# Patient Record
Sex: Female | Born: 1955
Health system: Southern US, Community
[De-identification: ages and names within clinical notes are randomized; demographics above are authoritative.]

## PROBLEM LIST (undated history)

## (undated) DIAGNOSIS — R490 Dysphonia: Secondary | ICD-10-CM

## (undated) DIAGNOSIS — R002 Palpitations: Secondary | ICD-10-CM

## (undated) DIAGNOSIS — Z9889 Other specified postprocedural states: Secondary | ICD-10-CM

## (undated) DIAGNOSIS — R112 Nausea with vomiting, unspecified: Secondary | ICD-10-CM

## (undated) HISTORY — PX: ESOPHAGOGASTRODUODENOSCOPY: SHX1529

## (undated) HISTORY — PX: MOUTH SURGERY: SHX715

## (undated) HISTORY — PX: TONSILLECTOMY: SUR1361

## (undated) HISTORY — PX: KNEE ARTHROSCOPY: SUR90

## (undated) HISTORY — PX: OTHER SURGICAL HISTORY: SHX169

---

## 2013-11-14 ENCOUNTER — Ambulatory Visit (INDEPENDENT_AMBULATORY_CARE_PROVIDER_SITE_OTHER): Payer: BC Managed Care – PPO | Admitting: Family Medicine

## 2013-11-14 VITALS — BP 122/78 | HR 86 | Temp 99.2°F | Resp 17 | Ht 62.5 in | Wt 121.0 lb

## 2013-11-14 DIAGNOSIS — J029 Acute pharyngitis, unspecified: Secondary | ICD-10-CM

## 2013-11-14 DIAGNOSIS — R0982 Postnasal drip: Secondary | ICD-10-CM

## 2013-11-14 LAB — POCT CBC
Granulocyte percent: 77.4 %G (ref 37–80)
HCT, POC: 42.1 % (ref 37.7–47.9)
Hemoglobin: 13.5 g/dL (ref 12.2–16.2)
Lymph, poc: 1.7 (ref 0.6–3.4)
MCH, POC: 31.8 pg — AB (ref 27–31.2)
MCHC: 32.1 g/dL (ref 31.8–35.4)
MCV: 99.4 fL — AB (ref 80–97)
MID (cbc): 0.4 (ref 0–0.9)
MPV: 9.1 fL (ref 0–99.8)
POC Granulocyte: 7 — AB (ref 2–6.9)
POC LYMPH PERCENT: 18.4 % (ref 10–50)
POC MID %: 4.2 %M (ref 0–12)
Platelet Count, POC: 257 10*3/uL (ref 142–424)
RBC: 4.24 M/uL (ref 4.04–5.48)
RDW, POC: 12.9 %
WBC: 9 10*3/uL (ref 4.6–10.2)

## 2013-11-14 LAB — POCT RAPID STREP A (OFFICE): Rapid Strep A Screen: NEGATIVE

## 2013-11-14 MED ORDER — FLUTICASONE PROPIONATE 50 MCG/ACT NA SUSP: 2.0000 | Freq: Every day | NASAL | Status: DC

## 2013-11-14 NOTE — Progress Notes (Signed)
Chief Complaint:  Chief Complaint  Patient presents with  . Sore Throat  . Laryngitis    HPI: Dawn Gilbert is a 58 y.o. female who is here for 3 month history of intermittent scratchy sore throat and then laryngitis, lasts for 4-5 days then goes away. She work at [employer]. No fevers, no rashes. She has some swollen glands .  No coughing. No wheezing, SOB, CP. She has laryngitis. Denies GERD. She thought it was allergies when it started 3 months ago. When she smokes it itches really bad and burns. She is a smoker.   History reviewed. No pertinent past medical history. History reviewed. No pertinent past surgical history. History   Social History  . Marital Status: Married    Spouse Name: N/A    Number of Children: N/A  . Years of Education: N/A   Social History Main Topics  . Smoking status: Current Every Day Smoker -- 0.50 packs/day for 31 years    Types: Cigarettes  . Smokeless tobacco: None  . Alcohol Use: None  . Drug Use: None  . Sexual Activity: None   Other Topics Concern  . None   Social History Narrative  . None   History reviewed. No pertinent family history. Allergies  Allergen Reactions  . Penicillins Hives   Prior to Admission medications   Not on File     ROS: The patient denies fevers, chills, night sweats, unintentional weight loss, chest pain, palpitations, wheezing, dyspnea on exertion, nausea, vomiting, abdominal pain, dysuria, hematuria, melena, numbness, weakness, or tingling.  All other systems have been reviewed and were otherwise negative with the exception of those mentioned in the HPI and as above.    PHYSICAL EXAM: Filed Vitals:   11/14/13 1024  BP: 122/78  Pulse: 86  Temp: 99.2 F (37.3 C)  Resp: 17   Filed Vitals:   11/14/13 1024  Height: 5' 2.5" (1.588 m)  Weight: 121 lb (54.885 kg)   Body mass index is 21.76 kg/(m^2).  General: Alert, no acute distress HEENT:  Normocephalic, atraumatic, oropharynx patent.  EOMI, PERRLA. + PND, no exudates, erthymeatous throat, TM nl Cardiovascular:  Regular rate and rhythm, no rubs murmurs or gallops.  No Carotid bruits, radial pulse intact. No pedal edema.  Respiratory: Clear to auscultation bilaterally.  No wheezes, rales, or rhonchi.  No cyanosis, no use of accessory musculature GI: No organomegaly, abdomen is soft and non-tender, positive bowel sounds.  No masses. Skin: No rashes. Neurologic: Facial musculature symmetric. Psychiatric: Patient is appropriate throughout our interaction. Lymphatic: No cervical lymphadenopathy Musculoskeletal: Gait intact.   LABS: Results for orders placed in visit on 11/14/13  POCT CBC      Result Value Range   WBC 9.0  4.6 - 10.2 K/uL   Lymph, poc 1.7  0.6 - 3.4   POC LYMPH PERCENT 18.4  10 - 50 %L   MID (cbc) 0.4  0 - 0.9   POC MID % 4.2  0 - 12 %M   POC Granulocyte 7.0 (*) 2 - 6.9   Granulocyte percent 77.4  37 - 80 %G   RBC 4.24  4.04 - 5.48 M/uL   Hemoglobin 13.5  12.2 - 16.2 g/dL   HCT, POC 42.1  37.7 - 47.9 %   MCV 99.4 (*) 80 - 97 fL   MCH, POC 31.8 (*) 27 - 31.2 pg   MCHC 32.1  31.8 - 35.4 g/dL   RDW, POC 12.9  Platelet Count, POC 257  142 - 424 K/uL   MPV 9.1  0 - 99.8 fL  POCT RAPID STREP A (OFFICE)      Result Value Range   Rapid Strep A Screen Negative  Negative     EKG/XRAY:   Primary read interpreted by Dr. Marin Comment at [facility].   ASSESSMENT/PLAN: Encounter Diagnoses  Name Primary?  . Acute pharyngitis Yes  . PND (post-nasal drip)    OTc nasocort or try flonase Strep cx pending F/u prn  Gross sideeffects, risk and benefits, and alternatives of medications d/w patient. Patient is aware that all medications have potential sideeffects and we are unable to predict every sideeffect or drug-drug interaction that may occur.  Greco Gastelum, [place], DO 11/14/2013 4:13 PM

## 2013-11-14 NOTE — Patient Instructions (Signed)
Pharyngitis °Pharyngitis is redness, pain, and swelling (inflammation) of your pharynx.  °CAUSES  °Pharyngitis is usually caused by infection. Most of the time, these infections are from viruses (viral) and are part of a cold. However, sometimes pharyngitis is caused by bacteria (bacterial). Pharyngitis can also be caused by allergies. Viral pharyngitis may be spread from person to person by coughing, sneezing, and personal items or utensils (cups, forks, spoons, toothbrushes). Bacterial pharyngitis may be spread from person to person by more intimate contact, such as kissing.  °SIGNS AND SYMPTOMS  °Symptoms of pharyngitis include:   °· Sore throat.   °· Tiredness (fatigue).   °· Low-grade fever.   °· Headache. °· Joint pain and muscle aches. °· Skin rashes. °· Swollen lymph nodes. °· Plaque-like film on throat or tonsils (often seen with bacterial pharyngitis). °DIAGNOSIS  °Your health care provider will ask you questions about your illness and your symptoms. Your medical history, along with a physical exam, is often all that is needed to diagnose pharyngitis. Sometimes, a rapid strep test is done. Other lab tests may also be done, depending on the suspected cause.  °TREATMENT  °Viral pharyngitis will usually get better in 3 4 days without the use of medicine. Bacterial pharyngitis is treated with medicines that kill germs (antibiotics).  °HOME CARE INSTRUCTIONS  °· Drink enough water and fluids to keep your urine clear or pale yellow.   °· Only take over-the-counter or prescription medicines as directed by your health care provider:   °· If you are prescribed antibiotics, make sure you finish them even if you start to feel better.   °· Do not take aspirin.   °· Get lots of rest.   °· Gargle with 8 oz of salt water (½ tsp of salt per 1 qt of water) as often as every 1 2 hours to soothe your throat.   °· Throat lozenges (if you are not at risk for choking) or sprays may be used to soothe your throat. °SEEK MEDICAL  CARE IF:  °· You have large, tender lumps in your neck. °· You have a rash. °· You cough up green, yellow-brown, or bloody spit. °SEEK IMMEDIATE MEDICAL CARE IF:  °· Your neck becomes stiff. °· You drool or are unable to swallow liquids. °· You vomit or are unable to keep medicines or liquids down. °· You have severe pain that does not go away with the use of recommended medicines. °· You have trouble breathing (not caused by a stuffy nose). °MAKE SURE YOU:  °· Understand these instructions. °· Will watch your condition. °· Will get help right away if you are not doing well or get worse. °Document Released: 10/20/2005 Document Revised: 08/10/2013 Document Reviewed: 06/27/2013 °[employer]® Patient Information ©2014 [employer]. ° °

## 2013-11-16 LAB — CULTURE, GROUP A STREP: Organism ID, Bacteria: NORMAL

## 2013-12-02 ENCOUNTER — Ambulatory Visit: Payer: Self-pay

## 2013-12-16 ENCOUNTER — Ambulatory Visit: Payer: Self-pay | Admitting: Family Medicine

## 2013-12-16 VITALS — BP 120/60 | HR 94 | Temp 98.0°F | Resp 16 | Ht 63.0 in | Wt 123.0 lb

## 2013-12-16 DIAGNOSIS — F17200 Nicotine dependence, unspecified, uncomplicated: Secondary | ICD-10-CM

## 2013-12-16 DIAGNOSIS — Z720 Tobacco use: Secondary | ICD-10-CM

## 2013-12-16 DIAGNOSIS — R490 Dysphonia: Secondary | ICD-10-CM

## 2013-12-16 NOTE — Patient Instructions (Signed)
We will refer you to ENT for eval in next 2 weeks. Continue allergy treatment with flonase and claritin. Ok to try zantac in case some acid reflux causing symptoms, but will need evaluation with ENT.  Return to the clinic or go to the nearest emergency room if any of your symptoms worsen or new symptoms occur.  Hoarseness Hoarseness is produced from a variety of causes. It is important to find the cause so it can be treated. In the absence of a cold or upper respiratory illness, any hoarseness lasting more than 2 weeks should be looked at by a specialist. This is especially important if you have a history of smoking or alcohol use. It is also important to keep in mind that as you grow older, your voice will naturally get weaker, making it easier for you to become hoarse from straining your vocal cords.  CAUSES  Any illness that affects your vocal cords can result in a hoarse voice. Examples of conditions that can affect the vocal cords are listed as follows:   Allergies.  Colds.  Sinusitis.  Gastroesophageal reflux disease.  Croup.  Injury.  Nodules.  Exposure to smoke or toxic fumes or gases.  Congenital and genetic defects.  Paralysis of the vocal cords.  Infections.  Advanced age. DIAGNOSIS  In order to diagnose the cause of your hoarseness, your caregiver will examine your throat using an instrument that uses a tube with a small lighted camera (laryngoscope). It allows your caregiver to look into the mouth and down the throat. TREATMENT  For most cases, treatment will focus on the specific cause of the hoarseness. Depending on the cause, hoarseness can be a temporary condition (acute) or it can be long lasting (chronic). Most cases of hoarseness clear up without complications. Your caregiver will explain to you if this is not likely to happen. SEEK IMMEDIATE MEDICAL CARE IF:   You have increasing hoarseness or loss of voice.  You have shortness of breath.  You are  coughing up blood.  There is pain in your neck or throat. Document Released: 10/03/2005 Document Revised: 01/12/2012 Document Reviewed: 12/26/2010 [facility] Patient Information 2014 [place], [place].

## 2013-12-16 NOTE — Progress Notes (Signed)
Subjective:    Patient ID: Dawn Gilbert, female    DOB: Feb 11, 1956, 58 y.o.   MRN: [id number] This chart was scribed for Dawn Ray, MD by Rolanda Lundborg, ED Scribe. This patient was seen in room 2 and the patient's care was started at 10:19 AM.  Chief Complaint  Patient presents with  . Hoarse    x 1 month    HPI HPI Comments: [name] is a 58 y.o. female who presents to the [facility] for a f/u on pharyngitis. She was last seen one month ago by Dr Marin Comment with a 3 month h/o intermittent scrathy sore throat and laryngitis that last up to 45 days then resolves. Diagnosed with acute pharyngitis. Recommend OTC Nasacort or Flonase for postnasal drip. Here for follow up.  She reports she is still having intermittent throat soreness on the left side. She states she is still hoarse, with little to no improvement. She states her voice gets more and more hoarse as the day goes on, especially with use of her voice. She has been using 2 sprays of Flonase on each side every morning. She denies heartburn, trouble swallowing, throat swelling. She has been smoking 2 cigarettes per day. Before the sickness she was smoking 1/2 PPD for 25 years. She denies smokeless tobacco use.   PCP - No primary provider on file.   There are no active problems to display for this patient.  History reviewed. No pertinent past medical history. History reviewed. No pertinent past surgical history. Allergies  Allergen Reactions  . Penicillins Hives   Prior to Admission medications   Medication Sig Start Date End Date Taking? Authorizing Provider  fluticasone (FLONASE) 50 MCG/ACT nasal spray Place 2 sprays into both nostrils daily. 11/14/13   Thao P Le, DO    History  Substance Use Topics  . Smoking status: Current Every Day Smoker -- 0.50 packs/day for 31 years    Types: Cigarettes  . Smokeless tobacco: Not on file  . Alcohol Use: Not on file      Review of Systems  Constitutional: Negative  for fever, chills, diaphoresis, appetite change and unexpected weight change.  HENT: Positive for sore throat and voice change. Negative for trouble swallowing.   Respiratory: Positive for cough (rarely, at night).        Objective:   Physical Exam  Vitals reviewed. Constitutional: She is oriented to person, place, and time. She appears well-developed and well-nourished. No distress.  HENT:  Head: Normocephalic and atraumatic.  Right Ear: Hearing, tympanic membrane, external ear and ear canal normal.  Left Ear: Hearing, tympanic membrane, external ear and ear canal normal.  Nose: Nose normal.  Mouth/Throat: Oropharynx is clear and moist. No oropharyngeal exudate or posterior oropharyngeal erythema.  Eyes: Conjunctivae and EOM are normal. Pupils are equal, round, and reactive to light.  Neck: Normal range of motion. No thyromegaly present.  Slight submandibular tenderness.  Cardiovascular: Normal rate, regular rhythm, normal heart sounds and intact distal pulses.   No murmur heard. Pulmonary/Chest: Effort normal and breath sounds normal. No respiratory distress. She has no wheezes. She has no rhonchi.  Lymphadenopathy:    She has no cervical adenopathy.  Neurological: She is alert and oriented to person, place, and time.  Skin: Skin is warm and dry. No rash noted.  Psychiatric: She has a normal mood and affect. Her behavior is normal.     Filed Vitals:   12/16/13 1007  BP: 120/60  Pulse: 94  Temp: 98 F (36.7 C)  TempSrc: Oral  Resp: 16  Height: 5\' 3"  (1.6 m)  Weight: 123 lb (55.792 kg)  SpO2: 96%        Assessment & Plan:   [name] is a 58 y.o. female Hoarseness of voice - Plan: Ambulatory referral to ENT  Tobacco abuse - Plan: Ambulatory referral to ENT  Persistent hoarseness for past month with intermittent laryngitis symptoms prior for 3 months. Ddx includes AR versus LPR but also with tobacco history, recommend ENT eval for possible further eval and imaging.  Referred to ENT and can try Zantac OTC in the mean time. rtc precautions.   No orders of the defined types were placed in this encounter.   Patient Instructions  We will refer you to ENT for eval in next 2 weeks. Continue allergy treatment with flonase and claritin. Ok to try zantac in case some acid reflux causing symptoms, but will need evaluation with ENT.  Return to the clinic or go to the nearest emergency room if any of your symptoms worsen or new symptoms occur.  Hoarseness Hoarseness is produced from a variety of causes. It is important to find the cause so it can be treated. In the absence of a cold or upper respiratory illness, any hoarseness lasting more than 2 weeks should be looked at by a specialist. This is especially important if you have a history of smoking or alcohol use. It is also important to keep in mind that as you grow older, your voice will naturally get weaker, making it easier for you to become hoarse from straining your vocal cords.  CAUSES  Any illness that affects your vocal cords can result in a hoarse voice. Examples of conditions that can affect the vocal cords are listed as follows:   Allergies.  Colds.  Sinusitis.  Gastroesophageal reflux disease.  Croup.  Injury.  Nodules.  Exposure to smoke or toxic fumes or gases.  Congenital and genetic defects.  Paralysis of the vocal cords.  Infections.  Advanced age. DIAGNOSIS  In order to diagnose the cause of your hoarseness, your caregiver will examine your throat using an instrument that uses a tube with a small lighted camera (laryngoscope). It allows your caregiver to look into the mouth and down the throat. TREATMENT  For most cases, treatment will focus on the specific cause of the hoarseness. Depending on the cause, hoarseness can be a temporary condition (acute) or it can be long lasting (chronic). Most cases of hoarseness clear up without complications. Your caregiver will explain to you if  this is not likely to happen. SEEK IMMEDIATE MEDICAL CARE IF:   You have increasing hoarseness or loss of voice.  You have shortness of breath.  You are coughing up blood.  There is pain in your neck or throat. Document Released: 10/03/2005 Document Revised: 01/12/2012 Document Reviewed: 12/26/2010 [facility] Patient Information 2014 [place], [place].     I personally performed the services described in this documentation, which was scribed in my presence. The recorded information has been reviewed and considered, and addended by me as needed.

## 2014-01-02 ENCOUNTER — Other Ambulatory Visit (HOSPITAL_COMMUNITY): Payer: Self-pay | Admitting: Otolaryngology

## 2014-01-03 ENCOUNTER — Encounter (HOSPITAL_COMMUNITY): Payer: Self-pay | Admitting: Pharmacy Technician

## 2014-01-03 NOTE — Pre-Procedure Instructions (Signed)
Dawn Gilbert  01/03/2014   Your procedure is scheduled on:  Fri, Mar 6 @ 7:30 AM  Report to [facility] Stay Entrance A  at 5:30 AM.  Call this number if you have problems the morning of surgery: [phone]   Remember:   Do not eat food or drink liquids after midnight.   Take these medicines the morning of surgery with A SIP OF WATER: Prednisone(Deltasone-if not completed dose pack day of surgery),Doxycycline(Doryx),and Fluticasone(Flonase-if needed)             No Goody's,BC's,Aleve,Aspirin,Ibuprofen,Fish Oil,or any Herbal Medications   Do not wear jewelry, make-up or nail polish.  Do not wear lotions, powders, or perfumes. You may wear deodorant.  Do not shave 48 hours prior to surgery.   Do not bring valuables to the hospital.  [facility] is not responsible                  for any belongings or valuables.               Contacts, dentures or bridgework may not be worn into surgery.  Leave suitcase in the car. After surgery it may be brought to your room.  For patients admitted to the hospital, discharge time is determined by your                treatment team.               Patients discharged the day of surgery will not be allowed to drive  home.    Special Instructions:  [place] - Preparing for Surgery  Before surgery, you can play an important role.  Because skin is not sterile, your skin needs to be as free of germs as possible.  You can reduce the number of germs on you skin by washing with CHG (chlorahexidine gluconate) soap before surgery.  CHG is an antiseptic cleaner which kills germs and bonds with the skin to continue killing germs even after washing.  Please DO NOT use if you have an allergy to CHG or antibacterial soaps.  If your skin becomes reddened/irritated stop using the CHG and inform your nurse when you arrive at Short Stay.  Do not shave (including legs and underarms) for at least 48 hours prior to the first CHG shower.  You may shave your  face.  Please follow these instructions carefully:   1.  Shower with CHG Soap the night before surgery and the                                morning of Surgery.  2.  If you choose to wash your hair, wash your hair first as usual with your       normal shampoo.  3.  After you shampoo, rinse your hair and body thoroughly to remove the                      Shampoo.  4.  Use CHG as you would any other liquid soap.  You can apply chg directly       to the skin and wash gently with scrungie or a clean washcloth.  5.  Apply the CHG Soap to your body ONLY FROM THE NECK DOWN.        Do not use on open wounds or open sores.  Avoid contact with your eyes,  ears, mouth and genitals (private parts).  Wash genitals (private parts)       with your normal soap.  6.  Wash thoroughly, paying special attention to the area where your surgery        will be performed.  7.  Thoroughly rinse your body with warm water from the neck down.  8.  DO NOT shower/wash with your normal soap after using and rinsing off       the CHG Soap.  9.  Pat yourself dry with a clean towel.            10.  Wear clean pajamas.            11.  Place clean sheets on your bed the night of your first shower and do not        sleep with pets.  Day of Surgery  Do not apply any lotions/deoderants the morning of surgery.  Please wear clean clothes to the hospital/surgery center.     Please read over the following fact sheets that you were given: Pain Booklet, Coughing and Deep Breathing and Surgical Site Infection Prevention

## 2014-01-04 ENCOUNTER — Encounter (HOSPITAL_COMMUNITY): Payer: Self-pay

## 2014-01-04 ENCOUNTER — Encounter (HOSPITAL_COMMUNITY)
Admission: RE | Admit: 2014-01-04 | Discharge: 2014-01-04 | Disposition: A | Discharge status: Home / Self Care | Payer: BC Managed Care – PPO | Source: Ambulatory Visit | Attending: Otolaryngology | Admitting: Otolaryngology

## 2014-01-04 ENCOUNTER — Emergency Department (HOSPITAL_COMMUNITY): Payer: BC Managed Care – PPO

## 2014-01-04 ENCOUNTER — Emergency Department (HOSPITAL_COMMUNITY)
Admission: EM | Admit: 2014-01-04 | Discharge: 2014-01-04 | Disposition: A | Discharge status: Home / Self Care | Payer: BC Managed Care – PPO | Attending: Emergency Medicine | Admitting: Emergency Medicine

## 2014-01-04 ENCOUNTER — Encounter (HOSPITAL_COMMUNITY): Payer: Self-pay | Admitting: Emergency Medicine

## 2014-01-04 ENCOUNTER — Other Ambulatory Visit: Payer: Self-pay

## 2014-01-04 ENCOUNTER — Encounter (HOSPITAL_COMMUNITY): Admission: RE | Admit: 2014-01-04 | Payer: BC Managed Care – PPO | Source: Ambulatory Visit

## 2014-01-04 DIAGNOSIS — IMO0002 Reserved for concepts with insufficient information to code with codable children: Secondary | ICD-10-CM | POA: Insufficient documentation

## 2014-01-04 DIAGNOSIS — Z792 Long term (current) use of antibiotics: Secondary | ICD-10-CM | POA: Insufficient documentation

## 2014-01-04 DIAGNOSIS — I4891 Unspecified atrial fibrillation: Secondary | ICD-10-CM

## 2014-01-04 DIAGNOSIS — I471 Supraventricular tachycardia, unspecified: Secondary | ICD-10-CM

## 2014-01-04 DIAGNOSIS — Z880 Allergy status to penicillin: Secondary | ICD-10-CM | POA: Insufficient documentation

## 2014-01-04 DIAGNOSIS — F17200 Nicotine dependence, unspecified, uncomplicated: Secondary | ICD-10-CM | POA: Insufficient documentation

## 2014-01-04 HISTORY — DX: Other specified postprocedural states: R11.2

## 2014-01-04 HISTORY — DX: Dysphonia: R49.0

## 2014-01-04 HISTORY — DX: Other specified postprocedural states: Z98.890

## 2014-01-04 LAB — BASIC METABOLIC PANEL
BUN: 15 mg/dL (ref 6–23)
CHLORIDE: 103 meq/L (ref 96–112)
CO2: 27 mEq/L (ref 19–32)
Calcium: 10.3 mg/dL (ref 8.4–10.5)
Creatinine, Ser: 0.86 mg/dL (ref 0.50–1.10)
GFR calc Af Amer: 84 mL/min — ABNORMAL LOW (ref >90)
GFR calc non Af Amer: 73 mL/min — ABNORMAL LOW (ref >90)
GLUCOSE: 90 mg/dL (ref 70–99)
POTASSIUM: 3.5 meq/L — AB (ref 3.7–5.3)
Sodium: 145 mEq/L (ref 137–147)

## 2014-01-04 LAB — COMPREHENSIVE METABOLIC PANEL
ALBUMIN: 4.2 g/dL (ref 3.5–5.2)
ALK PHOS: 48 U/L (ref 39–117)
ALT: 16 U/L (ref 0–35)
AST: 19 U/L (ref 0–37)
BUN: 16 mg/dL (ref 6–23)
CO2: 22 mEq/L (ref 19–32)
Calcium: 10.4 mg/dL (ref 8.4–10.5)
Chloride: 104 mEq/L (ref 96–112)
Creatinine, Ser: 0.84 mg/dL (ref 0.50–1.10)
GFR calc non Af Amer: 75 mL/min — ABNORMAL LOW (ref >90)
GFR, EST AFRICAN AMERICAN: 86 mL/min — AB (ref >90)
Glucose, Bld: 90 mg/dL (ref 70–99)
POTASSIUM: 3.7 meq/L (ref 3.7–5.3)
SODIUM: 144 meq/L (ref 137–147)
Total Bilirubin: 0.3 mg/dL (ref 0.3–1.2)
Total Protein: 7.3 g/dL (ref 6.0–8.3)

## 2014-01-04 LAB — CBC
HEMATOCRIT: 37.1 % (ref 36.0–46.0)
HEMOGLOBIN: 12.9 g/dL (ref 12.0–15.0)
MCH: 32.5 pg (ref 26.0–34.0)
MCHC: 34.8 g/dL (ref 30.0–36.0)
MCV: 93.5 fL (ref 78.0–100.0)
Platelets: 251 10*3/uL (ref 150–400)
RBC: 3.97 MIL/uL (ref 3.87–5.11)
RDW: 12.4 % (ref 11.5–15.5)
WBC: 11.2 10*3/uL — ABNORMAL HIGH (ref 4.0–10.5)

## 2014-01-04 LAB — CBC WITH DIFFERENTIAL/PLATELET
BASOS ABS: 0 10*3/uL (ref 0.0–0.1)
BASOS PCT: 0 % (ref 0–1)
EOS ABS: 0.1 10*3/uL (ref 0.0–0.7)
EOS PCT: 1 % (ref 0–5)
HCT: 37.4 % (ref 36.0–46.0)
Hemoglobin: 13 g/dL (ref 12.0–15.0)
Lymphocytes Relative: 32 % (ref 12–46)
Lymphs Abs: 3.8 10*3/uL (ref 0.7–4.0)
MCH: 32 pg (ref 26.0–34.0)
MCHC: 34.8 g/dL (ref 30.0–36.0)
MCV: 92.1 fL (ref 78.0–100.0)
Monocytes Absolute: 0.9 10*3/uL (ref 0.1–1.0)
Monocytes Relative: 7 % (ref 3–12)
NEUTROS PCT: 59 % (ref 43–77)
Neutro Abs: 7.1 10*3/uL (ref 1.7–7.7)
PLATELETS: 243 10*3/uL (ref 150–400)
RBC: 4.06 MIL/uL (ref 3.87–5.11)
RDW: 12.4 % (ref 11.5–15.5)
WBC: 11.9 10*3/uL — ABNORMAL HIGH (ref 4.0–10.5)

## 2014-01-04 LAB — TSH: TSH: 1.509 u[IU]/mL (ref 0.350–4.500)

## 2014-01-04 LAB — TROPONIN I: Troponin I: <0.3 ng/mL (ref <0.30)

## 2014-01-04 LAB — T4: T4 TOTAL: 8.2 ug/dL (ref 5.0–12.5)

## 2014-01-04 MED ORDER — SODIUM CHLORIDE 0.9 % IV SOLN
INTRAVENOUS | Status: DC
  Administered 2014-01-04: 125 mL/h via INTRAVENOUS

## 2014-01-04 MED ORDER — ADENOSINE 6 MG/2ML IV SOLN
INTRAVENOUS | Status: AC
  Filled 2014-01-04: qty 2

## 2014-01-04 MED ORDER — DEXTROSE 5 % IV SOLN
5.0000 mg/h | INTRAVENOUS | Status: DC
  Administered 2014-01-04 (×2): 5 mg/h via INTRAVENOUS

## 2014-01-04 MED ORDER — METOPROLOL SUCCINATE ER 25 MG PO TB24: 25.0000 mg | ORAL_TABLET | Freq: Every day | ORAL | Status: DC

## 2014-01-04 NOTE — ED Provider Notes (Addendum)
CSN: [id number]     Arrival date & time 01/04/14  5638 History   First MD Initiated Contact with Patient 01/04/14 0957     Chief Complaint  Patient presents with  . Palpitations     (Consider location/radiation/quality/duration/timing/severity/associated sxs/prior Treatment) Patient is a 58 y.o. female presenting with palpitations. The history is provided by the patient.  Palpitations  complaining of increased heart rate that was found on her EKG when she was in preadmission testing. She denies any chest pain or shortness of breath. Denies any palpitations. Denies any syncope or near-syncope. States she's been her baseline health. She was in preop testing for a scheduled for a biopsy. An EKG was performed which showed a heart rate 194. She has been asymptomatic. Denies any prior history of SVT or atrial fibrillation. No treatment used prior to arrival.  Past Medical History  Diagnosis Date  . PONV (postoperative nausea and vomiting)   . Hoarseness    Past Surgical History  Procedure Laterality Date  . Tonsillectomy    . Knee arthroscopy Bilateral   . Mouth surgery    . 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 Esophagogastroduodenoscopy     Family History  Problem Relation Age of Onset  . Cancer Mother   . Cancer Father    History  Substance Use Topics  . Smoking status: Current Every Day Smoker -- 0.25 packs/day for 25 years    Types: Cigarettes  . Smokeless tobacco: Not on file  . Alcohol Use: No   OB History   Grav Para Term Preterm Abortions TAB SAB Ect Mult Living                 Review of Systems  Cardiovascular: Positive for palpitations.  All other systems reviewed and are negative.      Allergies  Penicillins  Home Medications   Current Outpatient Rx  Name  Route  Sig  Dispense  Refill  . doxycycline (DORYX) 100 MG DR capsule   Oral   Take 100 mg by mouth 2 (two) times daily.         . fluticasone (FLONASE) 50 MCG/ACT nasal spray   Each Nare   Place  1 spray into both nostrils daily as needed for allergies.         . predniSONE (DELTASONE) 10 MG tablet   Oral   Take 10-40 mg by mouth daily with breakfast. Dose pack          Pulse 135  Resp 17  SpO2 99% Physical Exam  Nursing note and vitals reviewed. Constitutional: She is oriented to person, place, and time. She appears well-developed and well-nourished.  Non-toxic appearance. No distress.  HENT:  Head: Normocephalic and atraumatic.  Eyes: Conjunctivae, EOM and lids are normal. Pupils are equal, round, and reactive to light.  Neck: Normal range of motion. Neck supple. No tracheal deviation present. No mass present.  Cardiovascular: Normal heart sounds.  An irregularly irregular rhythm present. Tachycardia present.  Exam reveals no gallop.   No murmur heard. Pulmonary/Chest: Effort normal and breath sounds normal. No stridor. No respiratory distress. She has no decreased breath sounds. She has no wheezes. She has no rhonchi. She has no rales.  Abdominal: Soft. Normal appearance and bowel sounds are normal. She exhibits no distension. There is no tenderness. There is no rebound and no CVA tenderness.  Musculoskeletal: Normal range of motion. She exhibits no edema and no tenderness.  Neurological: She is alert and oriented  to person, place, and time. She has normal strength. No cranial nerve deficit or sensory deficit. GCS eye subscore is 4. GCS verbal subscore is 5. GCS motor subscore is 6.  Skin: Skin is warm and dry. No abrasion and no rash noted.  Psychiatric: She has a normal mood and affect. Her speech is normal and behavior is normal.    ED Course  Procedures (including critical care time) Labs Review Labs Reviewed  CBC WITH DIFFERENTIAL  COMPREHENSIVE METABOLIC PANEL  T4  TSH  TROPONIN I   Imaging Review No results found.   EKG Interpretation None      MDM   Final diagnoses:  None     Date: 01/04/2014  Rate: 143  Rhythm: atrial fibrillation  QRS  Axis: normal  Intervals: normal  ST/T Wave abnormalities: nonspecific ST changes  Conduction Disutrbances:none  Narrative Interpretation:   Old EKG Reviewed: none available  Patient's initial EKG from short stay shows her to be in SVT. I used a vagal maneuvers on her and she is in an atrial fibrillation with rapid ventricular rate response 140. She'll be given Cardizem bolus of 10 mg and started on a Cardizem drip at 5 mg. Awaiting labs and x-rays.    11:35 AM  Date: 01/04/2014  Rate: 72  Rhythm: normal sinus rhythm  QRS Axis: normal  Intervals: normal  ST/T Wave abnormalities: normal  Conduction Disutrbances:none  Narrative Interpretation:   Old EKG Reviewed: none available  Patient reassessed abdomen place on Cardizem drip and is now sinus rhythm. She remains asymptomatic. We'll have cardiology evaluate  4:17 PM Pt seen by cards and cleared for d/c   CRITICAL CARE Performed by: Leota Jacobsen Total critical care time: 60 Critical care time was exclusive of separately billable procedures and treating other patients. Critical care was necessary to treat or prevent imminent or life-threatening deterioration. Critical care was time spent personally by me on the following activities: development of treatment plan with patient and/or surrogate as well as nursing, discussions with consultants, evaluation of patient's response to treatment, examination of patient, obtaining history from patient or surrogate, ordering and performing treatments and interventions, ordering and review of laboratory studies, ordering and review of radiographic studies, pulse oximetry and re-evaluation of patient's condition.    Leota Jacobsen, MD 01/04/14 1137  Leota Jacobsen, MD 01/04/14 [phone]

## 2014-01-04 NOTE — ED Notes (Signed)
Family at bedside. 

## 2014-01-04 NOTE — ED Notes (Signed)
Patient was upstairs in Pre-Admission for a biopsy on Friday. Patient started complaining of palpitations in her chest. Nurse performed EKG and patient's heart rate 194 and patient in SVT. No past medical history. Patient states this has happened one time before but is not currently on any medication for HR.

## 2014-01-04 NOTE — ED Notes (Signed)
Chest xray at the bedside

## 2014-01-04 NOTE — Progress Notes (Addendum)
Pt doesn't have a cardiologist  Denies ever having an echo/stress test/heart cath  Denies EKG or CXR in the past yr    Medical Md is Dr.Jeffrey Carlota Raspberry

## 2014-01-04 NOTE — Discharge Instructions (Signed)
Nonspecific Tachycardia Tachycardia is a faster than normal heartbeat (more than 100 beats per minute). In adults, the heart normally beats between 60 and 100 times a minute. A fast heartbeat may be a normal response to exercise or stress. It does not necessarily mean that something is wrong. However, sometimes when your heart beats too fast it may not be able to pump enough blood to the rest of your body. This can result in chest pain, shortness of breath, dizziness, and even fainting. Nonspecific tachycardia means that the specific cause or pattern of your tachycardia is unknown. CAUSES  Tachycardia may be harmless or it may be due to a more serious underlying cause. Possible causes of tachycardia include:  Exercise or exertion.  Fever.  Pain or injury.  Infection.  Loss of body fluids (dehydration).  Overactive thyroid.  Lack of red blood cells (anemia).  Anxiety and stress.  Alcohol.  Caffeine.  Tobacco products.  Diet pills.  Illegal drugs.  Heart disease. SYMPTOMS  Rapid or irregular heartbeat (palpitations).  Suddenly feeling your heart beating (cardiac awareness).  Dizziness.  Tiredness (fatigue).  Shortness of breath.  Chest pain.  Nausea.  Fainting. DIAGNOSIS  Your caregiver will perform a physical exam and take your medical history. In some cases, a heart specialist (cardiologist) may be consulted. Your caregiver may also order:  Blood tests.  Electrocardiography. This test records the electrical activity of your heart.  A heart monitoring test. TREATMENT  Treatment will depend on the likely cause of your tachycardia. The goal is to treat the underlying cause of your tachycardia. Treatment methods may include:  Replacement of fluids or blood through an intravenous (IV) tube for moderate to severe dehydration or anemia.  New medicines or changes in your current medicines.  Diet and lifestyle changes.  Treatment for certain  infections.  Stress relief or relaxation methods. HOME CARE INSTRUCTIONS   Rest.  Drink enough fluids to keep your urine clear or pale yellow.  Do not smoke.  Avoid:  Caffeine.  Tobacco.  Alcohol.  Chocolate.  Stimulants such as over-the-counter diet pills or pills that help you stay awake.  Situations that cause anxiety or stress.  Illegal drugs such as marijuana, phencyclidine (PCP), and cocaine.  Only take medicine as directed by your caregiver.  Keep all follow-up appointments as directed by your caregiver. SEEK IMMEDIATE MEDICAL CARE IF:   You have pain in your chest, upper arms, jaw, or neck.  You become weak, dizzy, or feel faint.  You have palpitations that will not go away.  You vomit, have diarrhea, or pass blood in your stool.  Your skin is cool, pale, and wet.  You have a fever that will not go away with rest, fluids, and medicine. MAKE SURE YOU:   Understand these instructions.  Will watch your condition.  Will get help right away if you are not doing well or get worse. Document Released: 11/27/2004 Document Revised: 01/12/2012 Document Reviewed: 09/30/2011 Exit[employer] Patient Information 2014 [employer].  

## 2014-01-04 NOTE — ED Notes (Signed)
Cardiology MD at the bedside.

## 2014-01-04 NOTE — Progress Notes (Signed)
Notified Dr.Gore's nurse of pt being in SVT and taken to the ED-will let him know and they will follow up

## 2014-01-04 NOTE — ED Notes (Signed)
Patient waiting on Consult to Cardiology.

## 2014-01-04 NOTE — Progress Notes (Signed)
EKG was done on pt in preadmit showing SVT with rate of 194,O2 applied at 2l/m via N/C.Pt denies pain.States she had not had any palpitations until bloodwork was done.Take to ED via stretcher

## 2014-01-04 NOTE — Consult Note (Signed)
CARDIOLOGY CONSULT NOTE   Patient ID: Dawn Gilbert MRN: [id number], DOB/AGE: 11/18/1955 59 y.o. Date of Encounter: 01/04/2014  Primary Physician: [name], MD Primary Cardiologist: New  Chief Complaint:  PAF  HPI: Dawn Gilbert is a 58 y.o. female with no history of CAD. She came to the hospital today for pre-op evaluation as she has a vocal cord biopsy scheduled for 03/06. This is to be done by Dr. Simeon Craft and is for hoarseness.   She was in her usual state of health today. She has no history of chest pain, DOE, dizziness or presyncope. She had palpitations once about 10 years ago, was evaluated but symptoms had resolved by the time she saw a doctor.   She has had no recent illnesses, used no OTC meds, no change in caffeine use, no dramatic increase in tobacco use, no extreme stress. She started a steroid taper on 03/02, with 40 mg x 2 days, today decreases to 30 mg daily and down 10 mg every 2 days. She feels a little amped up due to that, but no discomfort.   They took vital signs as part of her evaluation and she remembers her heart rate was normal. These are charted and at 9:15, her heart rate was 86. She laid down to get her ECG and felt her heart suddenly go very fast. Her heart rate was 150, 194 and SVT by ECG. She was sent to the ER, and got carotid sinus massage, she went into rapid atrial fib. She was placed on Cardizem IV and spontaneously converted to SR. She is currently maintaining SR.   Past Medical History  Diagnosis Date  . PONV (postoperative nausea and vomiting)   . Hoarseness     Surgical History:  Past Surgical History  Procedure Laterality Date  . Tonsillectomy    . Knee arthroscopy Bilateral   . Mouth surgery    . 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 Esophagogastroduodenoscopy       I have reviewed the patient's current medications. Prior to Admission medications   Medication Sig Start Date End Date Taking? Authorizing Provider  doxycycline (DORYX)  100 MG DR capsule Take 100 mg by mouth 2 (two) times daily.   Yes Historical Provider, MD  fluticasone (FLONASE) 50 MCG/ACT nasal spray Place 1 spray into both nostrils daily as needed for allergies.   Yes Historical Provider, MD  ibuprofen (ADVIL,MOTRIN) 200 MG tablet Take 600 mg by mouth every 6 (six) hours as needed for moderate pain.   Yes Historical Provider, MD  predniSONE (DELTASONE) 10 MG tablet Take 10-40 mg by mouth daily with breakfast. Dose pack   Yes Historical Provider, MD   Scheduled Meds:  Continuous Infusions: . sodium chloride 125 mL/hr (01/04/14 1225)  . diltiazem (CARDIZEM) infusion 5 mg/hr (01/04/14 1211)   Allergies:  Allergies  Allergen Reactions  . Penicillins Hives    History   Social History  . Marital Status: Married    Spouse Name: N/A    Number of Children: N/A  . Years of Education: N/A   Occupational History  . [employer]    Social History Main Topics  . Smoking status: Current Every Day Smoker -- 0.50 packs/day for 25 years    Types: Cigarettes  . Smokeless tobacco: Not on file  . Alcohol Use: No     Comment: Rare use  . Drug Use: No  . Sexual Activity: Yes    Birth Control/ Protection: Post-menopausal  Other Topics Concern  . Not on file   Social History Narrative   Lives with husband in [place]    Family History  Problem Relation Age of Onset  . Cancer Mother   . Cancer Father    Family Status  Relation Status Death Age  . Mother Deceased 38    [place]  . Father Deceased 19    Lung [place]    Review of Systems:   Full 14-point review of systems otherwise negative except as noted above.  Physical Exam: Blood pressure 91/60, pulse 65, resp. rate 18, SpO2 100.00%. General: Well developed, well nourished,female in no acute distress. Head: Normocephalic, atraumatic, sclera non-icteric, no xanthomas, nares are without discharge. Dentition: good  Neck: No carotid bruits. JVD not elevated. No thyromegally Lungs:  Good expansion bilaterally. without wheezes or rhonchi.  Heart: Regular rate and rhythm with S1 S2.  No S3 or S4.  No murmur, no rubs, or gallops appreciated. Abdomen: Soft, non-tender, non-distended with normoactive bowel sounds. No hepatomegaly. No rebound/guarding. No obvious abdominal masses. Msk:  Strength and tone appear normal for age. No joint deformities or effusions, no spine or costo-vertebral angle tenderness. Extremities: No clubbing or cyanosis. No edema.  Distal pedal pulses are 2+ in 4 extrem Neuro: Alert and oriented X 3. Moves all extremities spontaneously. No focal deficits noted. Psych:  Responds to questions appropriately with a normal affect. Skin: No rashes or lesions noted  Labs:   Lab Results  Component Value Date   WBC 11.9* 01/04/2014   HGB 13.0 01/04/2014   HCT 37.4 01/04/2014   MCV 92.1 01/04/2014   PLT 243 01/04/2014     Recent Labs Lab 01/04/14 1000  NA 144  K 3.7  CL 104  CO2 22  BUN 16  CREATININE 0.84  CALCIUM 10.4  PROT 7.3  BILITOT 0.3  ALKPHOS 48  ALT 16  AST 19  GLUCOSE 90    Recent Labs  01/04/14 1000  TROPONINI <0.30   Radiology/Studies: Dg Chest Port 1 View 01/04/2014   CLINICAL DATA:  Pain and hoarseness  EXAM: PORTABLE CHEST - 1 VIEW  COMPARISON:  None.  FINDINGS: The lungs are clear. The heart size and pulmonary vascularity are normal. No adenopathy. No bone lesions.  IMPRESSION: No edema or consolidation.   Electronically Signed   By: Lowella Grip M.D.   On: 01/04/2014 10:40   ECG: SVT w/ LBBB Rate 194, then atrial fib, RVR w/ rate 145, then SR, no acute changes.    ASSESSMENT AND PLAN:  Active Problems:   * No active hospital problems. *   Signed, Rosaria Ferries, PA-C 01/04/2014 2:53 PM Beeper [phone] As above, patient seen and examined. Briefly she is a 58 year old female with no prior cardiac history. She does not have dyspnea on exertion, orthopnea, PND, pedal edema, syncope or exertional chest pain. She had an episode  of palpitations approximately 10 years ago. It lasted 20 minutes and resolved spontaneously. No further cardiac workup at that time. She presented today for preoperative evaluation prior to vocal cord biopsy. Prior to having her electrocardiogram she developed palpitations. Her electrocardiogram demonstrated wide complex tachycardia at a rate of 194. This was felt to be SVT with aberrancy. Carotid massage converted the patient to atrial fibrillation. She was given Cardizem and then converted to sinus rhythm. She is now asymptomatic. Followup electrocardiogram shows sinus rhythm with no ST changes and no preexcitation. Electrocardiograms were reviewed with Dr. Caryl Comes. Patient has SVT with aberrancy. Her  episodes are infrequent. I will treat with metoprolol 25 mg daily. If she has more frequent episodes in the future we will refer for ablation. Check echocardiogram and TSH. Followup with me in 2 weeks. Patient counseled on discontinuing tobacco. There is no contraindication to proceeding with biopsy of vocal cord. Kirk Ruths 4:13 PM

## 2014-01-05 ENCOUNTER — Ambulatory Visit (HOSPITAL_COMMUNITY)
Admission: RE | Admit: 2014-01-05 | Discharge: 2014-01-05 | Disposition: A | Discharge status: Home / Self Care | Payer: BC Managed Care – PPO | Source: Home / Self Care | Attending: Cardiology | Admitting: Cardiology

## 2014-01-05 DIAGNOSIS — I059 Rheumatic mitral valve disease, unspecified: Secondary | ICD-10-CM

## 2014-01-05 DIAGNOSIS — I4891 Unspecified atrial fibrillation: Secondary | ICD-10-CM

## 2014-01-05 NOTE — Progress Notes (Signed)
Anesthesia Note:  Patient is a 58 year old female scheduled for direct laryngoscopy tomorrow by Dr. Simeon Craft for evaluation of hoarseness.  She has been on doxycycline and Prednisone since 01/02/14. Her PAT visit was yesterday morning, and just after her lab draw she developed palpitations.  EKG was done and showed wide complex tachycardia (may be SVT or atrial flutter with left BBB or VT--Dr. Stanford Breed thought it was likely SVT with aberrancy).  She was otherwise asymptomatic. She was immediately transferred to the ED where she underwent vagal maneuvers and by notes she developed afib with RVR at 140 bpm.  She was started on IV Cardizem and evaluated by cardiologist Dr. Stanford Breed. She later converted to NSR as evidenced by follow-up EKG on 01/04/14 at 11:30 AM. He also had EP cardiologist Dr. Caryl Comes review EKGs.  She apparently had a similar episode ~ 10 years ago. She was ultimately discharged on metoprolol and set up for an echocardiogram today--report is still pending. It she develops more frequent episodes then ablation will be considered.  He is planning to have her follow-up in two weeks.  He felt there was no contraindication to proceeding with biopsy of her vocal cord tomorrow.  Labs and CXR from yesterday noted.  George Hugh [facility] Short Stay Center/Anesthesiology Phone [phone] 01/05/2014 2:02 PM

## 2014-01-05 NOTE — H&P (Addendum)
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  She now presents for direct laryngoscopy with biopsy of the false and true vocal folds.  Dr. Simeon Craft, Alroy Dust has discussed the risks (bleeding, hoarseness, airway injury, infection, risks of general anesthesia including heart attack/MI), benefits, and alternatives of this procedure. The patient understands the risks and would like to proceed with the procedure. The chances of success of the procedure are >50% and the patient understands this. I personally performed an examination of the patient within 24 hours of the procedure.  PAST MEDICAL HISTORY: Past Medical History  Diagnosis Date  . PONV (postoperative nausea and vomiting)   . Hoarseness     PAST SURGICAL HISTORY: Past Surgical History  Procedure Laterality Date  . Tonsillectomy    . Knee arthroscopy Bilateral   . Mouth surgery    . 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 Esophagogastroduodenoscopy      MEDICATIONS: No current facility-administered medications on file prior to encounter.   No current outpatient prescriptions on file prior to encounter.    ALLERGIES: Allergies  Allergen Reactions  . Penicillins Hives    SOCIAL HISTORY: History   Social History  . Marital Status: Married    Spouse Name: N/A    Number of Children: N/A  . Years of Education: N/A   Occupational History  . [employer]    Social History Main Topics  . Smoking status: Current Every Day Smoker -- 0.50 packs/day for 25 years    Types: Cigarettes  . Smokeless tobacco: Not on file  . Alcohol Use: No     Comment: Rare use  . Drug Use: No  . Sexual Activity: Yes    Birth Control/ Protection: Post-menopausal   Other Topics Concern  . Not on file   Social History  Narrative   Lives with husband in [place]. 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    FAMILY HISTORY:  Family History  Problem Relation Age of Onset  . Cancer Mother   . Cancer Father     REVIEW OF SYSTEMS:  HEENT: hoarseness, otherwise negative x 12 systems except per HPI   PHYSICAL EXAM:  GENERAL:NAD, no stridor   VITAL SIGNS:   Filed Vitals:   01/06/14 0622  BP: 125/70  Pulse: 70  Temp: 98 F (36.7 C)  Resp: 18   SKIN:  Warm, dry HEENT:  Hoarse voice, oral cavity grossly clear, office laryngoscopy showed diffuse bilateral false and true vocal fold mucosal irregularities. NECK:  Supple, trachea midline LYMPH:  No lymphadenopathy palpated ABDOMEN:  soft MUSCULOSKELETAL: normal strength PSYCH:  Normal affect NEUROLOGIC:  CN 2-12 intact and symmetric  DIAGNOSTIC STUDIES: had EKG from pre-op after patient went in to Afib, this then resolved to normal sinus rhythm with calcium channel blocker. Was evaluated by cardiology who cleared her for laryngoscopy but recommended metoprolol and possible ablation later if her symptoms are refractory to medical treatment.  ASSESSMENT AND PLAN: Plan to proceed with direct laryngoscopy with biopsy. Cardiology is treating her Atrial fibrillation and cleared her for direct laryngoscopy. Patient understands the risks, benefits, and alternatives.  Informed written consent signed witnessed and on chart. Dawn Gilbert 01/06/2014 7:15 AM

## 2014-01-05 NOTE — Progress Notes (Signed)
  Echocardiogram 2D Echocardiogram has been performed.  Basilia Jumbo 01/05/2014, 12:21 PM

## 2014-01-06 ENCOUNTER — Encounter (HOSPITAL_COMMUNITY)
Admission: RE | Disposition: A | Discharge status: Home / Self Care | Payer: Self-pay | Source: Ambulatory Visit | Attending: Otolaryngology

## 2014-01-06 ENCOUNTER — Ambulatory Visit (HOSPITAL_COMMUNITY): Payer: BC Managed Care – PPO | Admitting: Anesthesiology

## 2014-01-06 ENCOUNTER — Ambulatory Visit (HOSPITAL_COMMUNITY)
Admission: RE | Admit: 2014-01-06 | Discharge: 2014-01-06 | Disposition: A | Discharge status: Home / Self Care | Payer: BC Managed Care – PPO | Source: Ambulatory Visit | Attending: Otolaryngology | Admitting: Otolaryngology

## 2014-01-06 ENCOUNTER — Encounter (HOSPITAL_COMMUNITY): Payer: Self-pay | Admitting: *Deleted

## 2014-01-06 ENCOUNTER — Encounter (HOSPITAL_COMMUNITY): Payer: BC Managed Care – PPO | Admitting: Vascular Surgery

## 2014-01-06 DIAGNOSIS — Z880 Allergy status to penicillin: Secondary | ICD-10-CM | POA: Insufficient documentation

## 2014-01-06 DIAGNOSIS — R490 Dysphonia: Secondary | ICD-10-CM | POA: Insufficient documentation

## 2014-01-06 DIAGNOSIS — F17200 Nicotine dependence, unspecified, uncomplicated: Secondary | ICD-10-CM | POA: Insufficient documentation

## 2014-01-06 DIAGNOSIS — J387 Other diseases of larynx: Secondary | ICD-10-CM | POA: Insufficient documentation

## 2014-01-06 HISTORY — PX: DIRECT LARYNGOSCOPY: SHX5326

## 2014-01-06 HISTORY — DX: Palpitations: R00.2

## 2014-01-06 SURGERY — LARYNGOSCOPY, DIRECT
Anesthesia: General | Site: Mouth | Laterality: Bilateral

## 2014-01-06 MED ORDER — CLINDAMYCIN PHOSPHATE 600 MG/50ML IV SOLN
600.0000 mg | Freq: Once | INTRAVENOUS | Status: DC
  Filled 2014-01-06: qty 50

## 2014-01-06 MED ORDER — PROPOFOL 10 MG/ML IV BOLUS
INTRAVENOUS | Status: AC
  Filled 2014-01-06: qty 20

## 2014-01-06 MED ORDER — OXYCODONE HCL 5 MG/5ML PO SOLN: 5.0000 mg | Freq: Once | ORAL | Status: DC | PRN

## 2014-01-06 MED ORDER — LACTATED RINGERS IV SOLN: INTRAVENOUS | Status: DC | PRN

## 2014-01-06 MED ORDER — STERILE WATER FOR INJECTION IJ SOLN
INTRAMUSCULAR | Status: AC
  Filled 2014-01-06: qty 10

## 2014-01-06 MED ORDER — LIDOCAINE HCL (CARDIAC) 20 MG/ML IV SOLN
INTRAVENOUS | Status: AC
  Filled 2014-01-06: qty 5

## 2014-01-06 MED ORDER — ROCURONIUM BROMIDE 100 MG/10ML IV SOLN
INTRAVENOUS | Status: DC | PRN
  Administered 2014-01-06: 35 mg via INTRAVENOUS

## 2014-01-06 MED ORDER — LIDOCAINE HCL (CARDIAC) 20 MG/ML IV SOLN
INTRAVENOUS | Status: DC | PRN
  Administered 2014-01-06: 80 mg via INTRAVENOUS

## 2014-01-06 MED ORDER — GLYCOPYRROLATE 0.2 MG/ML IJ SOLN
INTRAMUSCULAR | Status: AC
  Filled 2014-01-06: qty 2

## 2014-01-06 MED ORDER — ARTIFICIAL TEARS OP OINT
TOPICAL_OINTMENT | OPHTHALMIC | Status: AC
  Filled 2014-01-06: qty 3.5

## 2014-01-06 MED ORDER — CLINDAMYCIN PHOSPHATE 600 MG/50ML IV SOLN
INTRAVENOUS | Status: DC | PRN
  Administered 2014-01-06: 600 mg via INTRAVENOUS

## 2014-01-06 MED ORDER — FENTANYL CITRATE 0.05 MG/ML IJ SOLN: 25.0000 ug | INTRAMUSCULAR | Status: DC | PRN

## 2014-01-06 MED ORDER — METOCLOPRAMIDE HCL 5 MG/ML IJ SOLN: 10.0000 mg | Freq: Once | INTRAMUSCULAR | Status: DC | PRN

## 2014-01-06 MED ORDER — DEXAMETHASONE SODIUM PHOSPHATE 4 MG/ML IJ SOLN
INTRAMUSCULAR | Status: DC | PRN
  Administered 2014-01-06: 4 mg via INTRAVENOUS

## 2014-01-06 MED ORDER — GLYCOPYRROLATE 0.2 MG/ML IJ SOLN
INTRAMUSCULAR | Status: DC | PRN
Start: 2014-01-06 — End: 2014-01-06
  Administered 2014-01-06: 0.4 mg via INTRAVENOUS

## 2014-01-06 MED ORDER — LACTATED RINGERS IV SOLN
INTRAVENOUS | Status: DC | PRN
  Administered 2014-01-06: 07:00:00 via INTRAVENOUS

## 2014-01-06 MED ORDER — FENTANYL CITRATE 0.05 MG/ML IJ SOLN
INTRAMUSCULAR | Status: AC
  Filled 2014-01-06: qty 5

## 2014-01-06 MED ORDER — PROPOFOL 10 MG/ML IV BOLUS
INTRAVENOUS | Status: DC | PRN
  Administered 2014-01-06: 160 mg via INTRAVENOUS

## 2014-01-06 MED ORDER — NEOSTIGMINE METHYLSULFATE 1 MG/ML IJ SOLN
INTRAMUSCULAR | Status: DC | PRN
  Administered 2014-01-06: 2 mg via INTRAVENOUS

## 2014-01-06 MED ORDER — ONDANSETRON HCL 4 MG/2ML IJ SOLN
INTRAMUSCULAR | Status: DC | PRN
  Administered 2014-01-06: 4 mg via INTRAVENOUS

## 2014-01-06 MED ORDER — OXYMETAZOLINE HCL 0.05 % NA SOLN
NASAL | Status: AC
  Filled 2014-01-06: qty 15

## 2014-01-06 MED ORDER — EPHEDRINE SULFATE 50 MG/ML IJ SOLN
INTRAMUSCULAR | Status: AC
  Filled 2014-01-06: qty 1

## 2014-01-06 MED ORDER — OXYMETAZOLINE HCL 0.05 % NA SOLN
NASAL | Status: DC | PRN
  Administered 2014-01-06: 1 via NASAL

## 2014-01-06 MED ORDER — MIDAZOLAM HCL 5 MG/5ML IJ SOLN
INTRAMUSCULAR | Status: DC | PRN
  Administered 2014-01-06 (×2): 1 mg via INTRAVENOUS

## 2014-01-06 MED ORDER — ROCURONIUM BROMIDE 50 MG/5ML IV SOLN
INTRAVENOUS | Status: AC
  Filled 2014-01-06: qty 1

## 2014-01-06 MED ORDER — ONDANSETRON HCL 4 MG/2ML IJ SOLN
INTRAMUSCULAR | Status: AC
  Filled 2014-01-06: qty 2

## 2014-01-06 MED ORDER — 0.9 % SODIUM CHLORIDE (POUR BTL) OPTIME
TOPICAL | Status: DC | PRN
  Administered 2014-01-06: 1000 mL

## 2014-01-06 MED ORDER — FENTANYL CITRATE 0.05 MG/ML IJ SOLN
INTRAMUSCULAR | Status: DC | PRN
  Administered 2014-01-06 (×4): 50 ug via INTRAVENOUS

## 2014-01-06 MED ORDER — OXYCODONE HCL 5 MG PO TABS: 5.0000 mg | ORAL_TABLET | Freq: Once | ORAL | Status: DC | PRN

## 2014-01-06 MED ORDER — DEXAMETHASONE SODIUM PHOSPHATE 10 MG/ML IJ SOLN
10.0000 mg | Freq: Once | INTRAMUSCULAR | Status: DC
  Filled 2014-01-06: qty 1

## 2014-01-06 MED ORDER — MIDAZOLAM HCL 2 MG/2ML IJ SOLN
INTRAMUSCULAR | Status: AC
  Filled 2014-01-06: qty 2

## 2014-01-06 SURGICAL SUPPLY — 25 items
BALLN PULM 15 16.5 18 X 75CM (BALLOONS)
BALLN PULM 15 16.5 18X75 (BALLOONS)
BALLOON PULM 15 16.5 18X75 (BALLOONS) IMPLANT
CANISTER SUCTION 2500CC (MISCELLANEOUS) ×3 IMPLANT
CONT SPEC 4OZ CLIKSEAL STRL BL (MISCELLANEOUS) ×12 IMPLANT
COVER MAYO STAND STRL (DRAPES) ×3 IMPLANT
COVER TABLE BACK 60X90 (DRAPES) ×3 IMPLANT
DRAPE PROXIMA HALF (DRAPES) ×3 IMPLANT
GAUZE SPONGE 4X4 16PLY XRAY LF (GAUZE/BANDAGES/DRESSINGS) ×3 IMPLANT
GLOVE SURG SS PI 7.0 STRL IVOR (GLOVE) ×3 IMPLANT
GLOVE SURG SS PI 7.5 STRL IVOR (GLOVE) ×3 IMPLANT
GOWN STRL NON-REIN LRG LVL3 (GOWN DISPOSABLE) IMPLANT
GUARD TEETH (MISCELLANEOUS) ×3 IMPLANT
MARKER SKIN DUAL TIP RULER LAB (MISCELLANEOUS) ×3 IMPLANT
NS IRRIG 1000ML POUR BTL (IV SOLUTION) ×3 IMPLANT
PAD ARMBOARD 7.5X6 YLW CONV (MISCELLANEOUS) ×3 IMPLANT
PATTIES SURGICAL .5 X1 (DISPOSABLE) ×3 IMPLANT
SOLUTION ANTI FOG 6CC (MISCELLANEOUS) ×3 IMPLANT
SPONGE GAUZE 4X4 12PLY (GAUZE/BANDAGES/DRESSINGS) ×3 IMPLANT
SURGILUBE 2OZ TUBE FLIPTOP (MISCELLANEOUS) ×3 IMPLANT
SYR INFLATE BILIARY GAUGE (MISCELLANEOUS) IMPLANT
TOWEL OR 17X24 6PK STRL BLUE (TOWEL DISPOSABLE) ×6 IMPLANT
TUBE CONNECTING 12'X1/4 (SUCTIONS) ×1
TUBE CONNECTING 12X1/4 (SUCTIONS) ×2 IMPLANT
WATER STERILE IRR 1000ML POUR (IV SOLUTION) IMPLANT

## 2014-01-06 NOTE — Transfer of Care (Signed)
Immediate Anesthesia Transfer of Care Note  Patient: Dawn Gilbert  Procedure(s) Performed: Procedure(s): DIRECT LARYNGOSCOPY with biopsy (Bilateral)  Patient Location: PACU  Anesthesia Type:General  Level of Consciousness: awake, alert  and oriented  Airway & Oxygen Therapy: Patient Spontanous Breathing and Patient connected to nasal cannula oxygen  Post-op Assessment: Report given to PACU RN and Post -op Vital signs reviewed and stable  Post vital signs: Reviewed and stable  Complications: No apparent anesthesia complications

## 2014-01-06 NOTE — Discharge Instructions (Signed)
Regular diet, up ad lib, Rx on chart for hydrocodone and zofran, follow up with Dr. Simeon Craft in one week.

## 2014-01-06 NOTE — Anesthesia Postprocedure Evaluation (Signed)
Anesthesia Post Note  Patient: Dawn Gilbert  Procedure(s) Performed: Procedure(s) (LRB): DIRECT LARYNGOSCOPY with biopsy (Bilateral)  Anesthesia type: General  Patient location: PACU  Post pain: Pain level controlled  Post assessment: Patient's Cardiovascular Status Stable  Last Vitals:  Filed Vitals:   01/06/14 0915  BP:   Pulse: 57  Temp:   Resp: 14    Post vital signs: Reviewed and stable  Level of consciousness: alert  Complications: No apparent anesthesia complications

## 2014-01-06 NOTE — Anesthesia Preprocedure Evaluation (Addendum)
Anesthesia Evaluation  Patient identified by MRN, date of birth, ID band Patient awake    Reviewed: Allergy & Precautions, H&P , NPO status , Patient's Chart, lab work & pertinent test results, reviewed documented beta blocker date and time   History of Anesthesia Complications (+) PONV and history of anesthetic complications  Airway Mallampati: II TM Distance: >3 FB Neck ROM: full    Dental  (+) Teeth Intact, Dental Advisory Given   Pulmonary Current Smoker,  breath sounds clear to auscultation        Cardiovascular Supra Ventricular Tachycardia + Valvular Problems/Murmurs MR Rhythm:regular     Neuro/Psych negative neurological ROS  negative psych ROS   GI/Hepatic negative GI ROS, Neg liver ROS,   Endo/Other  negative endocrine ROS  Renal/GU negative Renal ROS  negative genitourinary   Musculoskeletal   Abdominal   Peds  Hematology negative hematology ROS (+)   Anesthesia Other Findings See surgeon's H&P   Reproductive/Obstetrics negative OB ROS                         Anesthesia Physical Anesthesia Plan  ASA: III  Anesthesia Plan: General   Post-op Pain Management:    Induction: Intravenous  Airway Management Planned: Oral ETT  Additional Equipment:   Intra-op Plan:   Post-operative Plan: Extubation in OR  Informed Consent: I have reviewed the patients History and Physical, chart, labs and discussed the procedure including the risks, benefits and alternatives for the proposed anesthesia with the patient or authorized representative who has indicated his/her understanding and acceptance.   Dental Advisory Given  Plan Discussed with: CRNA and Surgeon  Anesthesia Plan Comments:         Anesthesia Quick Evaluation

## 2014-01-06 NOTE — Preoperative (Signed)
Beta Blockers   Reason not to administer Beta Blockers:Not Applicable 

## 2014-01-06 NOTE — Anesthesia Procedure Notes (Signed)
Procedure Name: Intubation Date/Time: 01/06/2014 7:35 AM Performed by: Storm Frisk ELLEN-ELIZABETH Pre-anesthesia Checklist: Patient identified, Timeout performed, Emergency Drugs available, Suction available and Patient being monitored Patient Re-evaluated:Patient Re-evaluated prior to inductionOxygen Delivery Method: Circle system utilized Preoxygenation: Pre-oxygenation with 100% oxygen Intubation Type: IV induction and Cricoid Pressure applied Ventilation: Mask ventilation without difficulty Tube size: 6.5 mm Number of attempts: 1 Airway Equipment and Method: Stylet Placement Confirmation: ETT inserted through vocal cords under direct vision,  positive ETCO2 and breath sounds checked- equal and bilateral Secured at: 20 cm Tube secured with: Tape Dental Injury: Teeth and Oropharynx as per pre-operative assessment

## 2014-01-06 NOTE — Op Note (Addendum)
DATE OF OPERATION: 01/06/2014 Surgeon: Ruby Cola Procedure Performed: direct laryngoscopy with biopsy with telescope [phone] PREOPERATIVE DIAGNOSIS: smoker, laryngeal masses POSTOPERATIVE DIAGNOSIS: smoker, laryngeal masses  SURGEON: Ruby Cola ANESTHESIA: General endotracheal.  ESTIMATED BLOOD LOSS: less than 5 mL.  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   Direct laryngoscopy was performed using the anterior commissure laryngoscope and the 0 degree 25mm Hopkins rod telescope. She was a grade I view and was then suspended with the suspension setup. The tongue base and posterior pharynx were normal. The false vocal folds and true vocal folds showed bilateral irregular mucosal changes and sessile irregular mucosa. Biopsies were performed of both true vocal folds using the small 45 degree laryngeal biopsy forceps. Frozens showed no obvious carcinoma but did show atypia and inflammation. Additional right and left true vocal fold biopsies were taken for permanent pathology, and then hemostasis was obtained with topical Afrin pledgets. Once hemostasis was noted in the larynx the glottis was suctioned out, the subglottis was noted to be patent, and the telescope, all Afrin pledgets, anterior commissure scope, and suspension setup and tooth guard (which was used to protect the upper dentition throughout the case) were all removed.  The patient was turned back to anesthesia and awakened from anesthesia and extubated without difficulty. The patient tolerated the procedure well with no immediate complications and was taken  to the postoperative recovery area in good condition.   Dr. Ruby Cola was present and performed the entire procedure. 01/06/2014  8:43 AM Ruby Cola

## 2014-01-10 ENCOUNTER — Encounter (HOSPITAL_COMMUNITY): Payer: Self-pay | Admitting: Otolaryngology

## 2014-03-02 ENCOUNTER — Encounter: Payer: BC Managed Care – PPO | Admitting: Cardiology

## 2014-03-31 ENCOUNTER — Encounter: Payer: BC Managed Care – PPO | Admitting: Nurse Practitioner

## 2014-04-07 DIAGNOSIS — C329 Malignant neoplasm of larynx, unspecified: Secondary | ICD-10-CM | POA: Insufficient documentation

## 2014-06-12 ENCOUNTER — Other Ambulatory Visit: Payer: Self-pay

## 2014-06-12 MED ORDER — METOPROLOL SUCCINATE ER 25 MG PO TB24: 25.0000 mg | ORAL_TABLET | Freq: Every day | ORAL | Status: DC

## 2014-09-18 ENCOUNTER — Ambulatory Visit (INDEPENDENT_AMBULATORY_CARE_PROVIDER_SITE_OTHER): Payer: BC Managed Care – PPO | Admitting: Cardiology

## 2014-09-18 ENCOUNTER — Encounter: Payer: Self-pay | Admitting: Cardiology

## 2014-09-18 VITALS — BP 110/72 | HR 65 | Ht 63.0 in | Wt 122.6 lb

## 2014-09-18 DIAGNOSIS — I471 Supraventricular tachycardia: Secondary | ICD-10-CM

## 2014-09-18 NOTE — Patient Instructions (Signed)
Continue current medications.  Restrict caffeine.  Your physician recommends that you schedule a follow-up appointment in: 6 months with Dr. Stanford Breed.

## 2014-09-18 NOTE — Progress Notes (Signed)
09/18/2014 [name]   [birth year]  [id number]  Primary Physician [name], MD Primary Cardiologist: Dr. Orpah Melter  HPI:  Mrs. Dawn Gilbert is a 58 year old female, followed by Dr. Stanford Breed, who presents to clinic today for follow-up regarding her history of SVT. She was first diagnosed in March 2015. At that time, she had presented to the hospital for pre-operative evaluation as she was scheduled to undergo vocal cord biopsy. During that time, she developed sudden onset of palpitations. EKG was obtained revealing SVT with a heart rate of 194 bpm. She was sent to the ER and got a carotid massage then went into rapid atrial fibrillation. She was placed on IV Cardizem and spontaneously converted back to normal sinus rhythm.TSH was within normal limits.  Echocardiogram revealed normal LV function with an estimated ejection fraction of 50-55%. She was seen by Dr. Stanford Breed as well as Dr. Caryl Comes. She was placed on metoprolol, 50 mg daily. It was recommended that she be considered for SVT ablation if she had any recurrent episodes.  She presents back to clinic today for follow-up. She has not been seen by our practice since March of this year. She reports that she failed to follow-up with Dr. Stanford Breed because she has been undergoing treatments for laryngeal cancer. She reports that she has completed her therapies and she is now in remission. Since March, she states that she has sustained 2-3 episodes of SVT. However, she states the symptoms were short lived only lasting 10-15 seconds. Her symptoms resolved spontaneously. She denies any prolonged episodes and no assocaited syncope/near-syncope. She reports full medication compliance with her metoprolol. She drinks on average 2 cups of coffee per day but otherwise denies any excessive caffeine intake. She also denies any excessive alcohol intake. She has been tolerating Metoprolol well. Blood pressure is stable.    Current Outpatient Prescriptions    Medication Sig Dispense Refill  . ibuprofen (ADVIL,MOTRIN) 200 MG tablet Take 600 mg by mouth every 6 (six) hours as needed for moderate pain.    . metoprolol succinate (TOPROL XL) 25 MG 24 hr tablet Take 1 tablet (25 mg total) by mouth daily. 90 tablet 0   No current facility-administered medications for this visit.    Allergies  Allergen Reactions  . Penicillins Hives    History   Social History  . Marital Status: Married    Spouse Name: N/A    Number of Children: N/A  . Years of Education: N/A   Occupational History  . [employer]    Social History Main Topics  . Smoking status: Former Smoker -- 0.50 packs/day for 25 years    Types: Cigarettes    Quit date: 01/06/2014  . Smokeless tobacco: Not on file  . Alcohol Use: No     Comment: Rare use  . Drug Use: No  . Sexual Activity: Yes    Birth Control/ Protection: Post-menopausal   Other Topics Concern  . Not on file   Social History Narrative   Lives with husband in [place]. 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     Review of Systems: General: negative for chills, fever, night sweats or weight changes.  Cardiovascular: negative for chest pain, dyspnea on exertion, edema, orthopnea, palpitations, paroxysmal nocturnal dyspnea or shortness of breath Dermatological: negative for rash Respiratory: negative for cough or wheezing Urologic: negative for hematuria Abdominal: negative for nausea, vomiting, diarrhea, bright red blood per rectum, melena, or hematemesis Neurologic: negative for visual changes, syncope, or dizziness All other systems  reviewed and are otherwise negative except as noted above.    Blood pressure 110/72, pulse 65, height 5\' 3"  (1.6 m), weight 122 lb 9.6 oz (55.611 kg).  General appearance: alert, cooperative and no distress Neck: no carotid bruit and no JVD Lungs: clear to auscultation bilaterally Heart: regular rate and rhythm, S1, S2 normal, no murmur, click, rub or gallop Extremities: no  LEE Pulses: 2+ and symmetric Skin: warm and dry Neurologic: Grossly normal  EKG normal sinus rhythm. Heart rate 65 bpm.  ASSESSMENT AND PLAN:   1. SVT: EKG today demonstrates sinus rhythm. Heart rate is 65 bpm. Since March of this year, the patient reports experiencing 2 episodes of palpitations/dizziness (felt to be SVT), that only lasted 10-15 seconds. She denies any prolonged episodes and no associated syncope/near-syncope. She reports full daily compliance with her Metoprolol as well as avoidance of excessive caffeine/alcohol intake. Her 2-D echocardiogram was normal in March of this year as well as TSH. Since her episodes have been infrequent and short-lived, she wishes to continue with medical therapy and wishes to avoid ablation at this time. She has been instructed to continue her Metoprolol daily and continue to avoid triggers. She understands that if she develops frequent recurrences or if symptoms become prolonged and fail to terminate spontaneously, then we may need to revisit the idea of SVT ablation/ referral back to Dr. Caryl Comes.   PLAN  Continue medical therapy with Metoprolol. Follow-up with Dr. Stanford Breed in 6 months or sooner if needed.  Avree Szczygiel, BRITTAINYPA-C 09/18/2014 9:08 AM

## 2015-04-27 ENCOUNTER — Other Ambulatory Visit: Payer: Self-pay | Admitting: Physician Assistant

## 2015-07-02 ENCOUNTER — Ambulatory Visit: Payer: Self-pay | Admitting: Cardiology

## 2015-07-19 ENCOUNTER — Ambulatory Visit (INDEPENDENT_AMBULATORY_CARE_PROVIDER_SITE_OTHER): Payer: BLUE CROSS/BLUE SHIELD | Admitting: Family Medicine

## 2015-07-19 VITALS — BP 114/70 | HR 74 | Temp 98.2°F | Resp 18 | Ht 63.25 in | Wt 124.2 lb

## 2015-07-19 DIAGNOSIS — R7989 Other specified abnormal findings of blood chemistry: Secondary | ICD-10-CM

## 2015-07-19 DIAGNOSIS — I4891 Unspecified atrial fibrillation: Secondary | ICD-10-CM | POA: Insufficient documentation

## 2015-07-19 DIAGNOSIS — Z923 Personal history of irradiation: Secondary | ICD-10-CM | POA: Diagnosis not present

## 2015-07-19 DIAGNOSIS — R946 Abnormal results of thyroid function studies: Secondary | ICD-10-CM | POA: Diagnosis not present

## 2015-07-19 NOTE — Progress Notes (Signed)
Subjective:  This chart was scribed for Dawn Ray, MD by [facility], medical scribe at [facility].The patient was seen in exam room 01 and the patient's care was started at 10:46 AM.   Patient ID: Dawn Gilbert, female    DOB: Dec 13, 1955, 59 y.o.   MRN: [id number] Chief Complaint  Patient presents with  . Thyroid Check    Wants to have her TSH levels checked again. TSH levels on Tuesday were high.    HPI HPI Comments: Dawn Gilbert is a 59 y.o. female who presents to [facility] for a follow up for possible elevated TSH. Hx of a-fib with RVR, noted to have SVT followed by cardiology. Dr. Stanford Breed. Last visit Nov 2015. Treated for laryngeal cancer on metoprolol at that time for SVT. Rare SVT at that time. ECHO in 2015, low TSH then. No operation on the tumor. 6 weeks of chemo and radiation. PET scan Nov 2015. CT chest and neck in Aug 2016, normal no thyroid growth. Seen by oncology at [facility], and radiology in [facility] Dr. Malva Limes. Weight has gone up 2-3 pounds over last 4 months. Hot in nature. No skin changes. More hair loss. Quit smoking in 2015.   Patient Active Problem List   Diagnosis Date Noted  . SVT (supraventricular tachycardia) 01/04/2014   Past Medical History  Diagnosis Date  . PONV (postoperative nausea and vomiting)   . Hoarseness   . Palpitations    Past Surgical History  Procedure Laterality Date  . Tonsillectomy    . Knee arthroscopy Bilateral   . Mouth surgery    . 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 Esophagogastroduodenoscopy    . Direct laryngoscopy Bilateral 01/06/2014    Procedure: DIRECT LARYNGOSCOPY with biopsy;  Surgeon: Ruby Cola, MD;  Location: [facility];  Service: ENT;  Laterality: Bilateral;   Allergies  Allergen Reactions  . Penicillins Hives   Prior to Admission medications   Medication Sig Start Date End Date Taking? Authorizing Provider  ibuprofen (ADVIL,MOTRIN) 200 MG tablet Take 600 mg by mouth  every 6 (six) hours as needed for moderate pain.   Yes Historical Provider, MD  metoprolol succinate (TOPROL-XL) 25 MG 24 hr tablet TAKE 1 TABLET (25 MG TOTAL) BY MOUTH DAILY. 04/30/15  Yes Larey Dresser, MD   Social History   Social History  . Marital Status: Married    Spouse Name: N/A  . Number of Children: N/A  . Years of Education: N/A   Occupational History  . [employer]    Social History Main Topics  . Smoking status: Former Smoker -- 0.50 packs/day for 25 years    Types: Cigarettes    Quit date: 01/06/2014  . Smokeless tobacco: Not on file  . Alcohol Use: No     Comment: Rare use  . Drug Use: No  . Sexual Activity: Yes    Birth Control/ Protection: Post-menopausal   Other Topics Concern  . Not on file   Social History Narrative   Lives with husband in [place]. 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   Review of Systems  Constitutional: Negative for unexpected weight change.  Skin: Negative for color change and pallor.      Objective:  BP 114/70 mmHg  Pulse 74  Temp(Src) 98.2 F (36.8 C) (Oral)  Resp 18  Ht 5' 3.25" (1.607 m)  Wt 124 lb 3.2 oz (56.337 kg)  BMI 21.82 kg/m2  SpO2 99% Physical Exam  Constitutional: She is oriented to person, place, and time. She appears well-developed and well-nourished. No distress.  HENT:  Head: Normocephalic and atraumatic.  Eyes: Pupils are equal, round, and reactive to light.  Neck: Normal range of motion. Neck supple. No thyromegaly present.  Cardiovascular: Normal rate, regular rhythm and normal heart sounds.  Exam reveals no gallop and no friction rub.   No murmur heard. Pulmonary/Chest: Effort normal and breath sounds normal. No respiratory distress. She has no wheezes.  Musculoskeletal: Normal range of motion.  Neurological: She is alert and oriented to person, place, and time.  Skin: Skin is warm and dry.  Psychiatric: She has a normal mood and affect. Her behavior is normal.  Nursing note and vitals reviewed.       Assessment & Plan:  Dawn Gilbert is a 59 y.o. female Elevated TSH - Plan: TSH, T4, Free, T3, Free  History of radiation to head and neck region - Plan: TSH, T4, Free, T3, Free  History of laryngeal cancer, status post chemotherapy and radiation to neck region. Recent visit with Dr. Malva Limes, radiation oncologist on September 13. TSH was 4.39.  We'll check TSH, free T3, free T4.  If remains borderline, would recheck in the next 4-6 weeks to determine if she is becoming hypothyroid after previous radiation to neck. RTC precautions if worsening symptoms.   No orders of the defined types were placed in this encounter.   Patient Instructions  You should receive a call or letter about your lab results within the next week to 10 days. Depending on the TSH level, it may be something we follow up in next 4-6 weeks. We will call to discuss this further with you or sent you a note by mychart.    I personally performed the services described in this documentation, which was scribed in my presence. The recorded information has been reviewed and considered, and addended by me as needed.

## 2015-07-19 NOTE — Patient Instructions (Signed)
You should receive a call or letter about your lab results within the next week to 10 days. Depending on the TSH level, it may be something we follow up in next 4-6 weeks. We will call to discuss this further with you or sent you a note by mychart.

## 2015-07-20 LAB — TSH: TSH: 5.63 u[IU]/mL — ABNORMAL HIGH (ref 0.350–4.500)

## 2015-07-20 LAB — T4, FREE: Free T4: 1.14 ng/dL (ref 0.80–1.80)

## 2015-07-20 LAB — T3, FREE: T3 FREE: 2.8 pg/mL (ref 2.3–4.2)

## 2015-07-23 ENCOUNTER — Other Ambulatory Visit: Payer: Self-pay | Admitting: Cardiology

## 2015-07-23 ENCOUNTER — Encounter: Payer: Self-pay | Admitting: Cardiology

## 2015-07-23 ENCOUNTER — Ambulatory Visit (INDEPENDENT_AMBULATORY_CARE_PROVIDER_SITE_OTHER): Payer: BLUE CROSS/BLUE SHIELD | Admitting: Cardiology

## 2015-07-23 VITALS — BP 104/66 | HR 70 | Ht 63.0 in | Wt 125.3 lb

## 2015-07-23 DIAGNOSIS — I471 Supraventricular tachycardia: Secondary | ICD-10-CM | POA: Diagnosis not present

## 2015-07-23 MED ORDER — METOPROLOL SUCCINATE ER 25 MG PO TB24: ORAL_TABLET | ORAL | Status: DC

## 2015-07-23 NOTE — Patient Instructions (Signed)
Continue same medications    Your physician wants you to follow-up with Dr.Crenshaw in 1 year. You will receive a reminder letter in the mail two months in advance. If you don't receive a letter, please call our office to schedule the follow-up appointment.

## 2015-07-23 NOTE — Progress Notes (Signed)
07/23/2015 Dawn Gilbert   September 18, 1956  [id number]  Primary Physician [name], MD Primary Cardiologist: Dr. Stanford Breed Electrophysiologist: Dr. Caryl Comes.  Reason for Visit/CC:  Yearly follow-up for SVT  HPI:  Mrs. Dawn Gilbert is a 59 year old female, followed by Dr. Stanford Breed, who presents to clinic today for follow-up regarding her history of SVT. She was first diagnosed in March 2015. At that time, she had presented to the hospital for pre-operative evaluation as she was scheduled to undergo vocal cord biopsy. During that time, she developed sudden onset of palpitations. EKG was obtained revealing SVT with a heart rate of 194 bpm. She was sent to the ER and got a carotid massage then went into rapid atrial fibrillation. She was placed on IV Cardizem and spontaneously converted back to normal sinus rhythm. TSH was within normal limits. Echocardiogram revealed normal LV function with an estimated ejection fraction of 50-55%. She was seen by Dr. Stanford Breed as well as Dr. Caryl Comes. She was placed on metoprolol, 50 mg daily. It was recommended that she be considered for SVT ablation if she had any recurrent episodes.  Since that time, she was diagnosed with laryngeal cancer. She underwent radiation and is now in remission. Recent THS was abnormal at 5.63 but f/u Free T3 and T4 were within normal limits.    She presents to clinic today for yearly follow-up. She reports that she has done well. She denies any significant recurrence of SVT. She has been fully compliant with Metroprolol. She has avoided triggers including excessive caffeine. She wishes to continue with medical therapy and wants to void ablation.   Current Outpatient Prescriptions  Medication Sig Dispense Refill  . ibuprofen (ADVIL,MOTRIN) 200 MG tablet Take 600 mg by mouth every 6 (six) hours as needed for moderate pain.    . metoprolol succinate (TOPROL-XL) 25 MG 24 hr tablet TAKE 1 TABLET (25 MG TOTAL) BY MOUTH DAILY. 30 tablet 2  .  PREVIDENT 5000 BOOSTER PLUS 1.1 % PSTE Take 1 application by mouth at bedtime.  11  . sodium fluoride (DENTA 5000 PLUS) 1.1 % CREA dental cream Take 1 application by mouth 2 (two) times daily.     No current facility-administered medications for this visit.    Allergies  Allergen Reactions  . Penicillins Hives    Social History   Social History  . Marital Status: Married    Spouse Name: N/A  . Number of Children: N/A  . Years of Education: N/A   Occupational History  . [employer]    Social History Main Topics  . Smoking status: Former Smoker -- 0.50 packs/day for 25 years    Types: Cigarettes    Quit date: 01/06/2014  . Smokeless tobacco: Not on file  . Alcohol Use: No     Comment: Rare use  . Drug Use: No  . Sexual Activity: Yes    Birth Control/ Protection: Post-menopausal   Other Topics Concern  . Not on file   Social History Narrative   Lives with husband in [place]. 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     Review of Systems: General: negative for chills, fever, night sweats or weight changes.  Cardiovascular: negative for chest pain, dyspnea on exertion, edema, orthopnea, palpitations, paroxysmal nocturnal dyspnea or shortness of breath Dermatological: negative for rash Respiratory: negative for cough or wheezing Urologic: negative for hematuria Abdominal: negative for nausea, vomiting, diarrhea, bright red blood per rectum, melena, or hematemesis Neurologic: negative for visual changes, syncope, or dizziness All other systems  reviewed and are otherwise negative except as noted above.    Blood pressure 104/66, pulse 70, height 5\' 3"  (1.6 m), weight 125 lb 4.8 oz (56.836 kg).  General appearance: alert, cooperative and no distress Neck: no carotid bruit and no JVD Lungs: clear to auscultation bilaterally Heart: regular rate and rhythm, S1, S2 normal, no murmur, click, rub or gallop Extremities: no LEE Pulses: 2+ and symmetric Skin: warm and dry Neurologic:  Grossly normal  EKG NSR. 70 bpm.   ASSESSMENT AND PLAN:   1. PSVT: no further recurrence. Recent thyroid studies are ok. Continue metoprolol. We discussed avoidance of triggers. If high recurrence in the future, can opt for SVT ablation.    PLAN  Continue BB therapy and avoidance of triggers. F/u with Dr. Stanford Breed in 1 year.   SIMMONS, BRITTAINY PA-C 07/23/2015 2:12 PM

## 2016-07-08 DIAGNOSIS — C320 Malignant neoplasm of glottis: Secondary | ICD-10-CM | POA: Diagnosis not present

## 2016-07-08 DIAGNOSIS — R918 Other nonspecific abnormal finding of lung field: Secondary | ICD-10-CM | POA: Diagnosis not present

## 2016-07-08 DIAGNOSIS — C329 Malignant neoplasm of larynx, unspecified: Secondary | ICD-10-CM | POA: Diagnosis not present

## 2016-07-08 DIAGNOSIS — J984 Other disorders of lung: Secondary | ICD-10-CM | POA: Diagnosis not present

## 2016-07-08 DIAGNOSIS — R911 Solitary pulmonary nodule: Secondary | ICD-10-CM | POA: Diagnosis not present

## 2016-07-09 DIAGNOSIS — C329 Malignant neoplasm of larynx, unspecified: Secondary | ICD-10-CM | POA: Diagnosis not present

## 2016-07-09 DIAGNOSIS — Z6822 Body mass index (BMI) 22.0-22.9, adult: Secondary | ICD-10-CM | POA: Diagnosis not present

## 2016-07-09 DIAGNOSIS — R918 Other nonspecific abnormal finding of lung field: Secondary | ICD-10-CM | POA: Diagnosis not present

## 2016-07-09 DIAGNOSIS — C4442 Squamous cell carcinoma of skin of scalp and neck: Secondary | ICD-10-CM | POA: Diagnosis not present

## 2016-07-10 ENCOUNTER — Other Ambulatory Visit: Payer: Self-pay | Admitting: Cardiology

## 2016-07-10 DIAGNOSIS — I471 Supraventricular tachycardia, unspecified: Secondary | ICD-10-CM

## 2016-07-22 ENCOUNTER — Encounter: Payer: Self-pay | Admitting: Cardiology

## 2016-07-30 NOTE — Progress Notes (Signed)
      HPI: Follow-up SVT. Patient was noted to have wide complex tachycardia in March 2015. This was felt to be SVT with aberrantly. Carotid massage converted the patient to atrial fibrillation. She was given Cardizem and then converted to sinus rhythm. Beta blockade added. Echocardiogram March 2015 showed normal LV systolic function and mild to moderate mitral regurgitation. Since last seen, the patient denies any dyspnea on exertion, orthopnea, PND, pedal edema, palpitations, syncope or chest pain.   Current Outpatient Prescriptions  Medication Sig Dispense Refill  . ibuprofen (ADVIL,MOTRIN) 200 MG tablet Take 600 mg by mouth every 6 (six) hours as needed for moderate pain.    . metoprolol succinate (TOPROL-XL) 25 MG 24 hr tablet Take 1 tablet (25 mg total) by mouth daily. Please keep 08/04/16 appt for further refills. 54 tablet 0  . PREVIDENT 5000 BOOSTER PLUS 1.1 % PSTE Take 1 application by mouth at bedtime.  11  . sodium fluoride (DENTA 5000 PLUS) 1.1 % CREA dental cream Take 1 application by mouth 2 (two) times daily.     No current facility-administered medications for this visit.      Past Medical History:  Diagnosis Date  . Hoarseness   . Palpitations   . PONV (postoperative nausea and vomiting)     Past Surgical History:  Procedure Laterality Date  . DIRECT LARYNGOSCOPY Bilateral 01/06/2014   Procedure: DIRECT LARYNGOSCOPY with biopsy;  Surgeon: Ruby Cola, MD;  Location: [facility];  Service: ENT;  Laterality: Bilateral;  . ESOPHAGOGASTRODUODENOSCOPY    . KNEE ARTHROSCOPY Bilateral   . MOUTH SURGERY    . TONSILLECTOMY    . tumor removed from left ovary      Social History   Social History  . Marital status: Married    Spouse name: N/A  . Number of children: N/A  . 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 Smoking status: Former Smoker    Packs/day: 0.50    Years: 25.00    Types:  Cigarettes    Quit date: 01/06/2014  . Smokeless tobacco: Never Used  . Alcohol use No     Comment: Rare use  . Drug use: No  . Sexual activity: Yes    Birth control/ protection: Post-menopausal   Other Topics Concern  . Not on file   Social History Narrative   Lives with husband in [place]. 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    Family History  Problem Relation Age of Onset  . Cancer Mother   . Cancer Father     ROS: no fevers or chills, productive cough, hemoptysis, dysphasia, odynophagia, melena, hematochezia, dysuria, hematuria, rash, seizure activity, orthopnea, PND, pedal edema, claudication. Remaining systems are negative.  Physical Exam: Well-developed well-nourished in no acute distress.  Skin is warm and dry.  HEENT is normal.  Neck is supple.  Chest is clear to auscultation with normal expansion.  Cardiovascular exam is regular rate and rhythm.  Abdominal exam nontender or distended. No masses palpated. Extremities show no edema. neuro grossly intact  ECG-Sinus rhythm at a rate of 71. No ST changes.  A/P  1 SVT-no recurrent episodes. Continue beta blocker. Can consider referral for ablation for refractory symptoms in the future.  2 history of laryngeal cancer-status post chemotherapy and radiation. She is in remission. Followed by oncology.  Kirk Ruths, MD

## 2016-08-04 ENCOUNTER — Ambulatory Visit (INDEPENDENT_AMBULATORY_CARE_PROVIDER_SITE_OTHER): Payer: Medicare Other | Admitting: Cardiology

## 2016-08-04 ENCOUNTER — Encounter: Payer: Self-pay | Admitting: Cardiology

## 2016-08-04 VITALS — BP 120/60 | HR 71 | Ht 63.0 in | Wt 127.0 lb

## 2016-08-04 DIAGNOSIS — I471 Supraventricular tachycardia: Secondary | ICD-10-CM | POA: Diagnosis not present

## 2016-08-04 NOTE — Patient Instructions (Signed)
Your physician wants you to follow-up in: ONE YEAR WITH DR CRENSHAW You will receive a reminder letter in the mail two months in advance. If you don't receive a letter, please call our office to schedule the follow-up appointment.   If you need a refill on your cardiac medications before your next appointment, please call your pharmacy.  

## 2016-08-13 ENCOUNTER — Other Ambulatory Visit: Payer: Self-pay | Admitting: Cardiology

## 2016-08-13 DIAGNOSIS — I471 Supraventricular tachycardia: Secondary | ICD-10-CM

## 2016-08-25 ENCOUNTER — Other Ambulatory Visit: Payer: Self-pay

## 2016-08-25 DIAGNOSIS — I471 Supraventricular tachycardia: Secondary | ICD-10-CM

## 2016-08-25 MED ORDER — METOPROLOL SUCCINATE ER 25 MG PO TB24: 25.0000 mg | ORAL_TABLET | Freq: Every day | ORAL | 3 refills | Status: DC

## 2016-09-12 ENCOUNTER — Other Ambulatory Visit: Payer: Self-pay | Admitting: Cardiology

## 2016-09-12 DIAGNOSIS — I471 Supraventricular tachycardia: Secondary | ICD-10-CM

## 2017-01-08 DIAGNOSIS — C329 Malignant neoplasm of larynx, unspecified: Secondary | ICD-10-CM | POA: Diagnosis not present

## 2017-01-08 DIAGNOSIS — Z6821 Body mass index (BMI) 21.0-21.9, adult: Secondary | ICD-10-CM | POA: Diagnosis not present

## 2017-01-08 DIAGNOSIS — R911 Solitary pulmonary nodule: Secondary | ICD-10-CM | POA: Diagnosis not present

## 2017-01-08 DIAGNOSIS — Z923 Personal history of irradiation: Secondary | ICD-10-CM | POA: Diagnosis not present

## 2017-01-08 DIAGNOSIS — Z9221 Personal history of antineoplastic chemotherapy: Secondary | ICD-10-CM | POA: Diagnosis not present

## 2017-01-21 DIAGNOSIS — R918 Other nonspecific abnormal finding of lung field: Secondary | ICD-10-CM | POA: Diagnosis not present

## 2017-01-21 DIAGNOSIS — R911 Solitary pulmonary nodule: Secondary | ICD-10-CM | POA: Diagnosis not present

## 2017-06-16 ENCOUNTER — Ambulatory Visit (INDEPENDENT_AMBULATORY_CARE_PROVIDER_SITE_OTHER): Payer: Medicare Other | Admitting: Family Medicine

## 2017-06-16 ENCOUNTER — Encounter: Payer: Self-pay | Admitting: Family Medicine

## 2017-06-16 VITALS — BP 123/71 | HR 88 | Temp 97.7°F | Resp 16 | Ht 63.0 in | Wt 123.0 lb

## 2017-06-16 DIAGNOSIS — Z1239 Encounter for other screening for malignant neoplasm of breast: Secondary | ICD-10-CM

## 2017-06-16 DIAGNOSIS — Z1211 Encounter for screening for malignant neoplasm of colon: Secondary | ICD-10-CM | POA: Diagnosis not present

## 2017-06-16 DIAGNOSIS — Z1231 Encounter for screening mammogram for malignant neoplasm of breast: Secondary | ICD-10-CM | POA: Diagnosis not present

## 2017-06-16 DIAGNOSIS — Z114 Encounter for screening for human immunodeficiency virus [HIV]: Secondary | ICD-10-CM | POA: Diagnosis not present

## 2017-06-16 DIAGNOSIS — Z923 Personal history of irradiation: Secondary | ICD-10-CM

## 2017-06-16 DIAGNOSIS — R946 Abnormal results of thyroid function studies: Secondary | ICD-10-CM | POA: Diagnosis not present

## 2017-06-16 DIAGNOSIS — R7989 Other specified abnormal findings of blood chemistry: Secondary | ICD-10-CM

## 2017-06-16 DIAGNOSIS — Z1159 Encounter for screening for other viral diseases: Secondary | ICD-10-CM

## 2017-06-16 NOTE — Patient Instructions (Addendum)
I will check thyroid and other blood tests.  obtain copy of last tdap - pharmacy can send to me to update your chart.  Return for physical - can discuss pap testing at that time.  I will refer you for mammogram.  Cologuard ordered for colon cancer screening.   IF you received an x-ray today, you will receive an invoice from [facility]. Please contact [facility] at [phone] with questions or concerns regarding your invoice.   IF you received labwork today, you will receive an invoice from [facility]. Please contact [facility] at [phone] with questions or concerns regarding your invoice.   Our billing staff will not be able to assist you with questions regarding bills from these companies.  You will be contacted with the lab results as soon as they are available. The fastest way to get your results is to activate your My Chart account. Instructions are located on the last page of this paperwork. If you have not heard from [place] regarding the results in 2 weeks, please contact this office.

## 2017-06-16 NOTE — Progress Notes (Addendum)
Subjective:  By signing my name below, I, Dawn Gilbert, attest that this documentation has been prepared under the direction and in the presence of Dawn Agreste, MD Electronically Signed: Ladene Artist, ED Scribe 06/16/2017 at 6:05 PM.   Patient ID: Dawn Gilbert, female    DOB: 1956-10-16, 61 y.o.   MRN: [id number]  Chief Complaint  Patient presents with  . Lab Draw    thyroid check hx of radiation    HPI Dawn Gilbert is a 61 y.o. female who presents to Primary Care at [facility] to have her thyroid checked due to h/o radiation. Pt finished radiation 3 years ago for laryngeal CA. Radiologist [facility], Oncologist [facility]. Denies fatigue, voice change, heat/cold intolerance, changes to skin or hair, weight changes. She is followed by her oncologist every 6 months and has had normal scans.   Lab Results  Component Value Date   TSH 5.630 (H) 07/19/2015   Wt Readings from Last 3 Encounters:  06/16/17 123 lb (55.8 kg)  08/04/16 127 lb (57.6 kg)  07/23/15 125 lb 4.8 oz (56.8 kg)   Patient Active Problem List   Diagnosis Date Noted  . A-fib ([place]) 07/19/2015  . Elevated TSH 07/19/2015  . Cancer of larynx ([place]) 04/07/2014  . SVT (supraventricular tachycardia) ([place]) 01/04/2014  . Supraventricular tachycardia ([place]) 01/04/2014   Past Medical History:  Diagnosis Date  . Hoarseness   . Palpitations   . PONV (postoperative nausea and vomiting)    Past Surgical History:  Procedure Laterality Date  . DIRECT LARYNGOSCOPY Bilateral 01/06/2014   Procedure: DIRECT LARYNGOSCOPY with biopsy;  Surgeon: Ruby Cola, MD;  Location: [facility];  Service: ENT;  Laterality: Bilateral;  . ESOPHAGOGASTRODUODENOSCOPY    . KNEE ARTHROSCOPY Bilateral   . MOUTH SURGERY    . TONSILLECTOMY    . tumor removed from left ovary     Allergies  Allergen Reactions  . Penicillins Hives   Prior to Admission medications   Medication Sig Start Date End Date Taking? Authorizing Provider  ibuprofen (ADVIL,MOTRIN) 200 MG  tablet Take 600 mg by mouth every 6 (six) hours as needed for moderate pain.    [provider]  metoprolol succinate (TOPROL-XL) 25 MG 24 hr tablet Take 1 tablet (25 mg total) by mouth daily. Please keep 08/04/16 appt for further refills. 08/25/16   Lelon Perla, MD  metoprolol succinate (TOPROL-XL) 25 MG 24 hr tablet TAKE 1 TABLET BY MOUTH DAILY. 09/12/16   Lelon Perla, MD  PREVIDENT 5000 BOOSTER PLUS 1.1 % PSTE Take 1 application by mouth at bedtime. 05/09/15   [provider]  sodium fluoride (DENTA 5000 PLUS) 1.1 % CREA dental cream Take 1 application by mouth 2 (two) times daily. 11/16/14   [provider]   Social History   Social History  . Marital status: Married    Spouse name: N/A  . Number of children: N/A  . 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 Smoking status: Former Smoker    Packs/day: 0.50    Years: 25.00    Types: Cigarettes    Quit date: 01/06/2014  . Smokeless tobacco: Never Used  . Alcohol use No     Comment: Rare use  . Drug use: No  . Sexual activity: Yes    Birth control/ protection: Post-menopausal   Other Topics Concern  . Not on file   Social History  Narrative   Lives with husband in [place]. 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   Review of Systems  Constitutional: Negative for fatigue and unexpected weight change.  HENT: Negative for voice change.   Endocrine: Negative for cold intolerance and heat intolerance.      Objective:   Physical Exam  Constitutional: She is oriented to person, place, and time. She appears well-developed and well-nourished.  HENT:  Head: Normocephalic and atraumatic.  Eyes: Pupils are equal, round, and reactive to light. Conjunctivae and EOM are normal.  Neck: Carotid bruit is not present.  Cardiovascular: Normal rate, regular rhythm, normal heart sounds and intact distal pulses.   Pulmonary/Chest: Effort normal and  breath sounds normal.  Abdominal: Soft. She exhibits no pulsatile midline mass. There is no tenderness.  Neurological: She is alert and oriented to person, place, and time.  Skin: Skin is warm and dry.  Psychiatric: She has a normal mood and affect. Her behavior is normal.  Vitals reviewed.  Vitals:   06/16/17 1728  BP: 123/71  Pulse: 88  Resp: 16  Temp: 97.7 F (36.5 C)  TempSrc: Oral  SpO2: 98%  Weight: 123 lb (55.8 kg)  Height: 5\' 3"  (1.6 m)      Assessment & Plan:  Dawn Gilbert is a 61 y.o. female Elevated TSH - Plan: TSH + free T4 History of radiation to head and neck region - Plan: TSH + free T4  - Prior elevated TSH, but normal free T4, free T3. Repeat TSH/ free T4. Ongoing evaluation every 6 months to one year with previous history of radiation. Asymptomatic.  Health maintenance discussed. Plan for physical, but some testing/evaluation done as below: Screening for HIV (human immunodeficiency virus) - Plan: HIV antibody Encounter for hepatitis C screening test for low risk patient - Plan: Hepatitis C antibody Screening for breast cancer - Plan: MM Digital Screening - mammogram ordered Screen for colon cancer - Plan: Cologuard ordered as requested over colonoscopy referral. We'll discuss Pap testing next visit as may not need repeat testing yet depending on previous results and testing. Up-to-date on tdap by her report. Requested copy of most recent tdap from pharmacy to be sent so we can abstract into chart.    No orders of the defined types were placed in this encounter.  Patient Instructions   I will check thyroid and other blood tests.  obtain copy of last tdap - pharmacy can send to me to update your chart.  Return for physical - can discuss pap testing at that time.  I will refer you for mammogram.  Cologuard ordered for colon cancer screening.   IF you received an x-ray today, you will receive an invoice from [facility]. Please contact [facility]  Radiology at [phone] with questions or concerns regarding your invoice.   IF you received labwork today, you will receive an invoice from [facility]. Please contact [facility] at [phone] with questions or concerns regarding your invoice.   Our billing staff will not be able to assist you with questions regarding bills from these companies.  You will be contacted with the lab results as soon as they are available. The fastest way to get your results is to activate your My Chart account. Instructions are located on the last page of this paperwork. If you have not heard from [place] regarding the results in 2 weeks, please contact this office.       I personally performed the services described in this documentation, which was scribed in  my presence. The recorded information has been reviewed and considered for accuracy and completeness, addended by me as needed, and agree with information above.  Signed,   [name], MD Primary Care at [place].  06/16/17 7:11 PM

## 2017-06-17 LAB — TSH+FREE T4
Free T4: 1.18 ng/dL (ref 0.82–1.77)
TSH: 7.31 u[IU]/mL — ABNORMAL HIGH (ref 0.450–4.500)

## 2017-06-17 LAB — HEPATITIS C ANTIBODY

## 2017-06-17 LAB — HIV ANTIBODY (ROUTINE TESTING W REFLEX): HIV Screen 4th Generation wRfx: NONREACTIVE

## 2017-07-13 DIAGNOSIS — Z1212 Encounter for screening for malignant neoplasm of rectum: Secondary | ICD-10-CM | POA: Diagnosis not present

## 2017-07-13 DIAGNOSIS — Z1211 Encounter for screening for malignant neoplasm of colon: Secondary | ICD-10-CM | POA: Diagnosis not present

## 2017-07-14 DIAGNOSIS — C760 Malignant neoplasm of head, face and neck: Secondary | ICD-10-CM | POA: Diagnosis not present

## 2017-07-14 DIAGNOSIS — C329 Malignant neoplasm of larynx, unspecified: Secondary | ICD-10-CM | POA: Diagnosis not present

## 2017-07-23 ENCOUNTER — Other Ambulatory Visit: Payer: Self-pay | Admitting: Cardiology

## 2017-07-23 DIAGNOSIS — I471 Supraventricular tachycardia: Secondary | ICD-10-CM

## 2017-07-29 ENCOUNTER — Ambulatory Visit
Admission: RE | Admit: 2017-07-29 | Discharge: 2017-07-29 | Disposition: A | Discharge status: Home / Self Care | Payer: Medicare Other | Source: Ambulatory Visit | Attending: Family Medicine | Admitting: Family Medicine

## 2017-07-29 DIAGNOSIS — Z1239 Encounter for other screening for malignant neoplasm of breast: Secondary | ICD-10-CM

## 2017-07-29 DIAGNOSIS — Z1231 Encounter for screening mammogram for malignant neoplasm of breast: Secondary | ICD-10-CM | POA: Diagnosis not present

## 2017-09-04 ENCOUNTER — Ambulatory Visit (INDEPENDENT_AMBULATORY_CARE_PROVIDER_SITE_OTHER): Payer: Medicare Other

## 2017-09-04 VITALS — BP 100/70 | HR 86 | Temp 98.3°F | Ht 63.0 in | Wt 123.4 lb

## 2017-09-04 DIAGNOSIS — Z0000 Encounter for general adult medical examination without abnormal findings: Secondary | ICD-10-CM | POA: Diagnosis not present

## 2017-09-04 DIAGNOSIS — Z23 Encounter for immunization: Secondary | ICD-10-CM

## 2017-09-04 NOTE — Progress Notes (Signed)
Subjective:   Dawn Gilbert is a 61 y.o. female who presents for an Initial Medicare Annual Wellness Visit.  Review of Systems    N/A  Cardiac Risk Factors include: none     Objective:    Today's Vitals   09/04/17 1318  BP: 100/70  Pulse: 86  Temp: 98.3 F (36.8 C)  TempSrc: Oral  SpO2: 100%  Weight: 123 lb 6 oz (56 kg)  Height: 5\' 3"  (1.6 m)   Body mass index is 21.85 kg/m.   Current Medications (verified) Outpatient Encounter Prescriptions as of 09/04/2017  Medication Sig  . ibuprofen (ADVIL,MOTRIN) 200 MG tablet Take 600 mg by mouth every 6 (six) hours as needed for moderate pain.  . metoprolol succinate (TOPROL-XL) 25 MG 24 hr tablet TAKE 1 TABLET BY MOUTH DAILY.  . metoprolol succinate (TOPROL-XL) 25 MG 24 hr tablet TAKE 1 TABLET DAILY (KEEP 08/04/2016 APPOINTMENT FOR FURTHER REFILLS)  . PREVIDENT 5000 BOOSTER PLUS 1.1 % PSTE Take 1 application by mouth at bedtime.  . sodium fluoride (DENTA 5000 PLUS) 1.1 % CREA dental cream Take 1 application by mouth 2 (two) times daily.   No facility-administered encounter medications on file as of 09/04/2017.     Allergies (verified) Penicillins   History: Past Medical History:  Diagnosis Date  . Hoarseness   . Palpitations   . PONV (postoperative nausea and vomiting)    Past Surgical History:  Procedure Laterality Date  . DIRECT LARYNGOSCOPY Bilateral 01/06/2014   Procedure: DIRECT LARYNGOSCOPY with biopsy;  Surgeon: Ruby Cola, MD;  Location: [facility];  Service: ENT;  Laterality: Bilateral;  . ESOPHAGOGASTRODUODENOSCOPY    . KNEE ARTHROSCOPY Bilateral   . MOUTH SURGERY    . TONSILLECTOMY    . tumor removed from left ovary     Family History  Problem Relation Age of Onset  . Cancer Mother   . Cancer Father   . 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 Smoking status: Former Smoker    Packs/day: 0.50    Years: 25.00      Types: Cigarettes    Quit date: 01/06/2014  . Smokeless tobacco: Never Used  . Alcohol use 0.0 oz/week     Comment: Rare use  . Drug use: No  . Sexual activity: Yes    Birth control/ protection: Post-menopausal    Tobacco Counseling Counseling given: Not Answered   Activities of Daily Living In your present state of health, do you have any difficulty performing the following activities: 09/04/2017  Hearing? Y  Comment Patient has age related hearing loss  Vision? N  Difficulty concentrating or making decisions? N  Walking or climbing stairs? N  Dressing or bathing? N  Doing errands, shopping? N  Preparing Food and eating ? N  Using the Toilet? N  In the past six months, have you accidently leaked urine? N  Do you have problems with loss of bowel control? N  Managing your Medications? N  Managing your Finances? N  Housekeeping or managing your Housekeeping? N  Some recent data might be hidden    Immunizations and Health Maintenance Immunization History  Administered Date(s) Administered  . Influenza,inj,Quad PF,6+ Mos 09/04/2017   Health Maintenance Due  Topic Date Due  . PAP SMEAR  11/30/1975    Patient Care Team: [name], MD as PCP - General (Family Medicine) Gabriel Carina,  MD as Referring Physician (Radiation Oncology)  Indicate any recent Medical Services you may have received from other than [facility] providers in the past year (date may be approximate).     Assessment:   This is a routine wellness examination for Dawn Gilbert.   Hearing/Vision screen Vision Screening Comments: Patient sees [facility] in [place] for her routine eye exams yearly.   Dietary issues and exercise activities discussed: Current Exercise Habits: Structured exercise class, Type of exercise: strength training/weights;Other - see comments (cardio), Time (Minutes): 60, Frequency (Times/Week): 5, Weekly Exercise (Minutes/Week): 300, Intensity: Moderate, Exercise limited by: None  identified  Goals    . Eat more fruits and vegetables          Patient states that she would like to try to eat a more healthier diet.       Depression Screen PHQ 2/9 Scores 09/04/2017 06/16/2017 07/19/2015  PHQ - 2 Score 0 0 0    Fall Risk Fall Risk  09/04/2017 06/16/2017  Falls in the past year? No No    Cognitive Function:     6CIT Screen 09/04/2017  What Year? 0 points  What month? 0 points  What time? 0 points  Count back from 20 0 points  Months in reverse 0 points  Repeat phrase 2 points  Total Score 2    Screening Tests Health Maintenance  Topic Date Due  . PAP SMEAR  11/30/1975  . MAMMOGRAM  07/30/2019  . TETANUS/TDAP  05/04/2023  . COLONOSCOPY  07/14/2027  . INFLUENZA VACCINE  Completed  . Hepatitis C Screening  Completed  . HIV Screening  Completed      Plan:   I have personally reviewed and noted the following in the patient's chart:   . Medical and social history . Use of alcohol, tobacco or illicit drugs  . Current medications and supplements . Functional ability and status . Nutritional status . Physical activity . Advanced directives . List of other physicians . Hospitalizations, surgeries, and ER visits in previous 12 months . Vitals . Screenings to include cognitive, depression, and falls . Referrals and appointments  In addition, I have reviewed and discussed with patient certain preventive protocols, quality metrics, and best practice recommendations. A written personalized care plan for preventive services as well as general preventive health recommendations were provided to patient.    Patient wants to wait and have blood work done at her visit with her PCP because she would like thyroid levels checked also.    Andrez Grime, LPN   [id number]

## 2017-09-04 NOTE — Patient Instructions (Addendum)
Dawn Gilbert , Thank you for taking time to come for your Medicare Wellness Visit. I appreciate your ongoing commitment to your health goals. Please review the following plan we discussed and let me know if I can assist you in the future.   Screening recommendations/referrals: Colonoscopy: up to date, Cologuard next due 07/13/2020 Mammogram: up to date, next due 07/31/2019  Bone Density: start at age 62 Recommended yearly ophthalmology/optometry visit for glaucoma screening and checkup Recommended yearly dental visit for hygiene and checkup  Vaccinations: Influenza vaccine: administered today  Pneumococcal vaccine: starts at age 54 Tdap vaccine: up to date, next due July 2024 Shingles vaccine: Check with your pharmacy about receiving this vaccine     Advanced directives: Advance directive discussed with you today. I have provided a copy for you to complete at home and have notarized. Once this is complete please bring a copy in to our office so we can scan it into your chart.   Conditions/risks identified: Try to work on eating a more healthier diet.   Next appointment: schedule follow up visit with PCP, 1 year for AWV   Preventive Care 40-64 Years, Female Preventive care refers to lifestyle choices and visits with your health care provider that can promote health and wellness. What does preventive care include?  A yearly physical exam. This is also called an annual well check.  Dental exams once or twice a year.  Routine eye exams. Ask your health care provider how often you should have your eyes checked.  Personal lifestyle choices, including:  Daily care of your teeth and gums.  Regular physical activity.  Eating a healthy diet.  Avoiding tobacco and drug use.  Limiting alcohol use.  Practicing safe sex.  Taking low-dose aspirin daily starting at age 39.  Taking vitamin and mineral supplements as recommended by your health care provider. What happens during an annual  well check? The services and screenings done by your health care provider during your annual well check will depend on your age, overall health, lifestyle risk factors, and family history of disease. Counseling  Your health care provider may ask you questions about your:  Alcohol use.  Tobacco use.  Drug use.  Emotional well-being.  Home and relationship well-being.  Sexual activity.  Eating habits.  Work and work Statistician.  Method of birth control.  Menstrual cycle.  Pregnancy history. Screening  You may have the following tests or measurements:  Height, weight, and BMI.  Blood pressure.  Lipid and cholesterol levels. These may be checked every 5 years, or more frequently if you are over 59 years old.  Skin check.  Lung cancer screening. You may have this screening every year starting at age 105 if you have a 30-pack-year history of smoking and currently smoke or have quit within the past 15 years.  Fecal occult blood test (FOBT) of the stool. You may have this test every year starting at age 42.  Flexible sigmoidoscopy or colonoscopy. You may have a sigmoidoscopy every 5 years or a colonoscopy every 10 years starting at age 47.  Hepatitis C blood test.  Hepatitis B blood test.  Sexually transmitted disease (STD) testing.  Diabetes screening. This is done by checking your blood sugar (glucose) after you have not eaten for a while (fasting). You may have this done every 1-3 years.  Mammogram. This may be done every 1-2 years. Talk to your health care provider about when you should start having regular mammograms. This may depend on whether  you have a family history of breast cancer.  BRCA-related cancer screening. This may be done if you have a family history of breast, ovarian, tubal, or peritoneal cancers.  Pelvic exam and Pap test. This may be done every 3 years starting at age 27. Starting at age 25, this may be done every 5 years if you have a Pap test in  combination with an HPV test.  Bone density scan. This is done to screen for osteoporosis. You may have this scan if you are at high risk for osteoporosis. Discuss your test results, treatment options, and if necessary, the need for more tests with your health care provider. Vaccines  Your health care provider may recommend certain vaccines, such as:  Influenza vaccine. This is recommended every year.  Tetanus, diphtheria, and acellular pertussis (Tdap, Td) vaccine. You may need a Td booster every 10 years.  Zoster vaccine. You may need this after age 77.  Pneumococcal 13-valent conjugate (PCV13) vaccine. You may need this if you have certain conditions and were not previously vaccinated.  Pneumococcal polysaccharide (PPSV23) vaccine. You may need one or two doses if you smoke cigarettes or if you have certain conditions. Talk to your health care provider about which screenings and vaccines you need and how often you need them. This information is not intended to replace advice given to you by your health care provider. Make sure you discuss any questions you have with your health care provider. Document Released: 11/16/2015 Document Revised: 07/09/2016 Document Reviewed: 08/21/2015 Elsevier Interactive Patient Education  2017 [place] Prevention in the Home Falls can cause injuries. They can happen to people of all ages. There are many things you can do to make your home safe and to help prevent falls. What can I do on the outside of my home?  Regularly fix the edges of walkways and driveways and fix any cracks.  Remove anything that might make you trip as you walk through a door, such as a raised step or threshold.  Trim any bushes or trees on the path to your home.  Use bright outdoor lighting.  Clear any walking paths of anything that might make someone trip, such as rocks or tools.  Regularly check to see if handrails are loose or broken. Make sure that both  sides of any steps have handrails.  Any raised decks and porches should have guardrails on the edges.  Have any leaves, snow, or ice cleared regularly.  Use sand or salt on walking paths during winter.  Clean up any spills in your garage right away. This includes oil or grease spills. What can I do in the bathroom?  Use night lights.  Install grab bars by the toilet and in the tub and shower. Do not use towel bars as grab bars.  Use non-skid mats or decals in the tub or shower.  If you need to sit down in the shower, use a plastic, non-slip stool.  Keep the floor dry. Clean up any water that spills on the floor as soon as it happens.  Remove soap buildup in the tub or shower regularly.  Attach bath mats securely with double-sided non-slip rug tape.  Do not have throw rugs and other things on the floor that can make you trip. What can I do in the bedroom?  Use night lights.  Make sure that you have a light by your bed that is easy to reach.  Do not use any sheets or  blankets that are too big for your bed. They should not hang down onto the floor.  Have a firm chair that has side arms. You can use this for support while you get dressed.  Do not have throw rugs and other things on the floor that can make you trip. What can I do in the kitchen?  Clean up any spills right away.  Avoid walking on wet floors.  Keep items that you use a lot in easy-to-reach places.  If you need to reach something above you, use a strong step stool that has a grab bar.  Keep electrical cords out of the way.  Do not use floor polish or wax that makes floors slippery. If you must use wax, use non-skid floor wax.  Do not have throw rugs and other things on the floor that can make you trip. What can I do with my stairs?  Do not leave any items on the stairs.  Make sure that there are handrails on both sides of the stairs and use them. Fix handrails that are broken or loose. Make sure that  handrails are as long as the stairways.  Check any carpeting to make sure that it is firmly attached to the stairs. Fix any carpet that is loose or worn.  Avoid having throw rugs at the top or bottom of the stairs. If you do have throw rugs, attach them to the floor with carpet tape.  Make sure that you have a light switch at the top of the stairs and the bottom of the stairs. If you do not have them, ask someone to add them for you. What else can I do to help prevent falls?  Wear shoes that:  Do not have high heels.  Have rubber bottoms.  Are comfortable and fit you well.  Are closed at the toe. Do not wear sandals.  If you use a stepladder:  Make sure that it is fully opened. Do not climb a closed stepladder.  Make sure that both sides of the stepladder are locked into place.  Ask someone to hold it for you, if possible.  Clearly mark and make sure that you can see:  Any grab bars or handrails.  First and last steps.  Where the edge of each step is.  Use tools that help you move around (mobility aids) if they are needed. These include:  Canes.  Walkers.  Scooters.  Crutches.  Turn on the lights when you go into a dark area. Replace any light bulbs as soon as they burn out.  Set up your furniture so you have a clear path. Avoid moving your furniture around.  If any of your floors are uneven, fix them.  If there are any pets around you, be aware of where they are.  Review your medicines with your doctor. Some medicines can make you feel dizzy. This can increase your chance of falling. Ask your doctor what other things that you can do to help prevent falls. This information is not intended to replace advice given to you by your health care provider. Make sure you discuss any questions you have with your health care provider. Document Released: 08/16/2009 Document Revised: 03/27/2016 Document Reviewed: 11/24/2014 Elsevier Interactive Patient Education  2017  [employer].

## 2018-01-05 DIAGNOSIS — C329 Malignant neoplasm of larynx, unspecified: Secondary | ICD-10-CM | POA: Diagnosis not present

## 2018-02-26 ENCOUNTER — Telehealth: Payer: Self-pay | Admitting: Family Medicine

## 2018-02-26 NOTE — Telephone Encounter (Signed)
Copied from [place] [id number]. Topic: Quick Communication - See Telephone Encounter >> Feb 26, 2018  9:15 AM Robina Ade, Helene Kelp D wrote: CRM for notification. See Telephone encounter for: 02/26/18. Patient wants to come in for a thyroid check labs requested by her oncologist. Please call patient back if she can have this done here.

## 2018-02-26 NOTE — Telephone Encounter (Signed)
Pt is due for OV. Pt sent to scheduling.

## 2018-03-04 ENCOUNTER — Other Ambulatory Visit: Payer: Self-pay

## 2018-03-04 ENCOUNTER — Encounter: Payer: Self-pay | Admitting: Family Medicine

## 2018-03-04 ENCOUNTER — Ambulatory Visit (INDEPENDENT_AMBULATORY_CARE_PROVIDER_SITE_OTHER): Payer: Medicare Other | Admitting: Family Medicine

## 2018-03-04 VITALS — BP 110/68 | HR 67 | Temp 98.0°F | Ht 63.0 in | Wt 129.0 lb

## 2018-03-04 DIAGNOSIS — Z13220 Encounter for screening for lipoid disorders: Secondary | ICD-10-CM

## 2018-03-04 DIAGNOSIS — Z131 Encounter for screening for diabetes mellitus: Secondary | ICD-10-CM

## 2018-03-04 DIAGNOSIS — R7989 Other specified abnormal findings of blood chemistry: Secondary | ICD-10-CM | POA: Diagnosis not present

## 2018-03-04 DIAGNOSIS — M7591 Shoulder lesion, unspecified, right shoulder: Secondary | ICD-10-CM | POA: Diagnosis not present

## 2018-03-04 DIAGNOSIS — E039 Hypothyroidism, unspecified: Secondary | ICD-10-CM

## 2018-03-04 DIAGNOSIS — Z124 Encounter for screening for malignant neoplasm of cervix: Secondary | ICD-10-CM

## 2018-03-04 DIAGNOSIS — Z923 Personal history of irradiation: Secondary | ICD-10-CM | POA: Diagnosis not present

## 2018-03-04 LAB — COLOGUARD: COLOGUARD: NEGATIVE

## 2018-03-04 NOTE — Progress Notes (Signed)
Subjective:  By signing my name below, I, Dawn Gilbert, attest that this documentation has been prepared under the direction and in the presence of Dawn Agreste, MD Electronically Signed: Ladene Artist, ED Scribe 03/04/2018 at 10:15 AM.   Patient ID: Dawn Gilbert, female    DOB: [birth year], 62 y.o.   MRN: [id number]  Chief Complaint  Patient presents with  . TSH check    request some other  blood work (Fasting)   HPI Dawn Gilbert is a 62 y.o. female who presents to Primary Care at [facility] for f/u of elevated TSH. H/o radiation for laryngeal CA. Radiologist in [facility]. Oncologist with [facility]. When last discussed in Aug, she was having f/u every 6 months with oncology, normal scans. Discussed need for CPE but did address health maintenance items at that time. Pt is fasting at this visit.  Lab Results  Component Value Date   TSH 7.310 (H) 06/16/2017  Free T4 normal at 1.18. TSH increased from 5.6 in Sept 2016. - Pt reports some weight gain. Denies fatigue, palpitations, heat/cold intolerance, change in nails, hair or skin.  Wt Readings from Last 3 Encounters:  03/04/18 129 lb (58.5 kg)  09/04/17 123 lb 6 oz (56 kg)  06/16/17 123 lb (55.8 kg)   Health Maintenance Ordered cologuard last visit but I do not see where this has been performed. Hep C testing, neg. Discussed pap testing; planned on returning for CPE. Mammogram 07/2017. - Pt does not have an OBGYN. States she did the cologuard 07/2017 but never received results. Pap will be updated today.  Dermatology Referral Pt reports a gradually enlarging, firm lesion to the R shoulder first noticed 6 months ago. States the area rubs against her bra strap. No personal or family h/o skin CA.  Patient Active Problem List   Diagnosis Date Noted  . A-fib ([place]) 07/19/2015  . Elevated TSH 07/19/2015  . Cancer of larynx ([place]) 04/07/2014  . SVT (supraventricular tachycardia) ([place]) 01/04/2014  . Supraventricular tachycardia ([place]) 01/04/2014    Past Medical History:  Diagnosis Date  . Hoarseness   . Palpitations   . PONV (postoperative nausea and vomiting)    Past Surgical History:  Procedure Laterality Date  . DIRECT LARYNGOSCOPY Bilateral 01/06/2014   Procedure: DIRECT LARYNGOSCOPY with biopsy;  Surgeon: Ruby Cola, MD;  Location: [place];  Service: ENT;  Laterality: Bilateral;  . ESOPHAGOGASTRODUODENOSCOPY    . KNEE ARTHROSCOPY Bilateral   . MOUTH SURGERY    . TONSILLECTOMY    . tumor removed from left ovary     Allergies  Allergen Reactions  . Penicillins Hives   Prior to Admission medications   Medication Sig Start Date End Date Taking? Authorizing Provider  ibuprofen (ADVIL,MOTRIN) 200 MG tablet Take 600 mg by mouth every 6 (six) hours as needed for moderate pain.    [provider]  metoprolol succinate (TOPROL-XL) 25 MG 24 hr tablet TAKE 1 TABLET BY MOUTH DAILY. 09/12/16   Lelon Perla, MD  metoprolol succinate (TOPROL-XL) 25 MG 24 hr tablet TAKE 1 TABLET DAILY (KEEP 08/04/2016 APPOINTMENT FOR FURTHER REFILLS) 07/23/17   Lelon Perla, MD  PREVIDENT 5000 BOOSTER PLUS 1.1 % PSTE Take 1 application by mouth at bedtime. 05/09/15   [provider]  sodium fluoride (DENTA 5000 PLUS) 1.1 % CREA dental cream Take 1 application by mouth 2 (two) times daily. 11/16/14   [provider]   Social History   Socioeconomic History  . Marital  status: Married    Spouse name: Not on file  . Number of children: Not on file  . Years of education: Not on file  . Highest education level: Not on file  Occupational History  . Occupation: Stage manager: [employer]  Social Needs  . Financial resource strain: Not on file  . Food insecurity:    Worry: Not on file    Inability: Not on file  . Transportation needs:    Medical: Not on file    Non-medical: Not on file  Tobacco Use  . Smoking status: Former Smoker    Packs/day: 0.50    Years: 25.00    Pack years: 12.50     Types: Cigarettes    Last attempt to quit: 01/06/2014    Years since quitting: 4.1  . Smokeless tobacco: Never Used  Substance and Sexual Activity  . Alcohol use: Yes    Alcohol/week: 0.0 oz    Comment: Rare use  . Drug use: No  . Sexual activity: Yes    Birth control/protection: Post-menopausal  Lifestyle  . Physical activity:    Days per week: Not on file    Minutes per session: Not on file  . Stress: Not on file  Relationships  . Social connections:    Talks on phone: Not on file    Gets together: Not on file    Attends religious service: Not on file    Active member of club or organization: Not on file    Attends meetings of clubs or organizations: Not on file    Relationship status: Not on file  . Intimate partner violence:    Fear of current or ex partner: Not on file    Emotionally abused: Not on file    Physically abused: Not on file    Forced sexual activity: Not on file  Other Topics Concern  . Not on file  Social History Narrative   Lives with husband in [place]. 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   Review of Systems  Constitutional: Positive for unexpected weight change. Negative for fatigue.  Cardiovascular: Negative for palpitations.  Endocrine: Negative for cold intolerance and heat intolerance.  Skin: Negative for color change.       + Lesion to R shoulder      Objective:   Physical Exam  Constitutional: She is oriented to person, place, and time. She appears well-developed and well-nourished.  HENT:  Head: Normocephalic and atraumatic.  Eyes: Pupils are equal, round, and reactive to light. Conjunctivae and EOM are normal.  Neck: Carotid bruit is not present.  Cardiovascular: Normal rate, regular rhythm, normal heart sounds and intact distal pulses.  Pulmonary/Chest: Effort normal and breath sounds normal.  Abdominal: Soft. She exhibits no pulsatile midline mass. There is no tenderness.  Genitourinary:  Genitourinary Comments: No external lesions. Cervix  appeared normal.  Neurological: She is alert and oriented to person, place, and time.  Skin: Skin is warm and dry.  Enlarged cystic area on R anterior shoulder measuring ~12 mm x 9 mm with larger aspect superiorly. No surrounding erythema. Flesh colored.  Psychiatric: She has a normal mood and affect. Her behavior is normal.  Vitals reviewed.  Vitals:   03/04/18 0939  BP: 110/68  Pulse: 67  Temp: 98 F (36.7 C)  TempSrc: Oral  SpO2: 98%  Weight: 129 lb (58.5 kg)  Height: 5\' 3"  (1.6 m)      Assessment & Plan:  Dawn Gilbert  is a 62 y.o. female Elevated TSH - Plan: TSH + free T4 History of radiation to head and neck region - Plan: TSH + free T4  -Previously elevating TSH, clinically may have some component of hypothyroidism given symptoms above.  Check TSH, free T4, consider low-dose Synthroid with repeat testing in 6 weeks if medication started.  Screening for diabetes mellitus - Plan: Comprehensive metabolic panel  Screening for hyperlipidemia - Plan: Lipid panel  Lesion of right shoulder - Plan: Ambulatory referral to Dermatology  -Appears to be cystic, but will have further evaluation with dermatology.  Screening for cervical cancer - Plan: Pap IG w/ reflex to HPV when ASC-U  -Pap testing performed without concerning findings on exam.  No orders of the defined types were placed in this encounter.  Patient Instructions   I will refer you to dermatology for the area on your shoulder.   Depending on thyroid test, may need to start meds. If that is the case, will need to repeat testing in 6 weeks.   Pap testing and screening blood work today.  Thanks for coming in today.    Keeping You Healthy  Get These Tests  Blood Pressure- Have your blood pressure checked by your healthcare provider at least once a year.  Normal blood pressure is 120/80.  Weight- Have your body mass index (BMI) calculated to screen for obesity.  BMI is a measure of body fat based on height and  weight.  You can calculate your own BMI at [employer]  Cholesterol- Have your cholesterol checked every year.  Diabetes- Have your blood sugar checked every year if you have high blood pressure, high cholesterol, a family history of diabetes or if you are overweight.  Pap Test - Have a pap test every 1 to 5 years if you have been sexually active.  If you are older than 65 and recent pap tests have been normal you may not need additional pap tests.  In addition, if you have had a hysterectomy  for benign disease additional pap tests are not necessary.  Mammogram-Yearly mammograms are essential for early detection of breast cancer  Screening for Colon Cancer- Colonoscopy starting at age 40. Screening may begin sooner depending on your family history and other health conditions.  Follow up colonoscopy as directed by your Gastroenterologist.  Screening for Osteoporosis- Screening begins at age 17 with bone density scanning, sooner if you are at higher risk for developing Osteoporosis.  Get these medicines  Calcium with Vitamin D- Your body requires 1200-1500 mg of Calcium a day and [phone] IU of Vitamin D a day.  You can only absorb 500 mg of Calcium at a time therefore Calcium must be taken in 2 or 3 separate doses throughout the day.  Hormones- Hormone therapy has been associated with increased risk for certain cancers and heart disease.  Talk to your healthcare provider about if you need relief from menopausal symptoms.  Aspirin- Ask your healthcare provider about taking Aspirin to prevent Heart Disease and Stroke.  Get these Immuniztions  Flu shot- Every fall  Pneumonia shot- Once after the age of 65; if you are younger ask your healthcare provider if you need a pneumonia shot.  Tetanus- Every ten years.  Zostavax- Once after the age of 38 to prevent shingles.  Take these steps  Don't smoke- Your healthcare provider can help you quit. For tips on how to quit, ask  your healthcare provider or go to www.smokefree.gov or call 1-800 QUIT-NOW.  Be  physically active- Exercise 5 days a week for a minimum of 30 minutes.  If you are not already physically active, start slow and gradually work up to 30 minutes of moderate physical activity.  Try walking, dancing, bike riding, swimming, etc.  Eat a healthy diet- Eat a variety of healthy foods such as fruits, vegetables, whole grains, low fat milk, low fat cheeses, yogurt, lean meats, chicken, fish, eggs, dried beans, tofu, etc.  For more information go to www.thenutritionsource.org  Dental visit- Brush and floss teeth twice daily; visit your dentist twice a year.  Eye exam- Visit your Optometrist or Ophthalmologist yearly.  Drink alcohol in moderation- Limit alcohol intake to one drink or less a day.  Never drink and drive.  Depression- Your emotional health is as important as your physical health.  If you're feeling down or losing interest in things you normally enjoy, please talk to your healthcare provider.  Seat Belts- can save your life; always wear one  Smoke/Carbon Monoxide detectors- These detectors need to be installed on the appropriate level of your home.  Replace batteries at least once a year.  Violence- If anyone is threatening or hurting you, please tell your healthcare provider.  Living Will/ Health care power of attorney- Discuss with your healthcare provider and family.  IF you received an x-ray today, you will receive an invoice from [facility]. Please contact [facility] at [phone] with questions or concerns regarding your invoice.   IF you received labwork today, you will receive an invoice from [place]. Please contact [facility] at [phone] with questions or concerns regarding your invoice.   Our billing staff will not be able to assist you with questions regarding bills from these companies.  You will be contacted with the lab results as soon as they are  available. The fastest way to get your results is to activate your My Chart account. Instructions are located on the last page of this paperwork. If you have not heard from [place] regarding the results in 2 weeks, please contact this office.       I personally performed the services described in this documentation, which was scribed in my presence. The recorded information has been reviewed and considered for accuracy and completeness, addended by me as needed, and agree with information above.  Signed,   [name], MD Primary Care at [place].  03/05/18 12:19 PM

## 2018-03-04 NOTE — Patient Instructions (Addendum)
I will refer you to dermatology for the area on your shoulder.   Depending on thyroid test, may need to start meds. If that is the case, will need to repeat testing in 6 weeks.   Pap testing and screening blood work today.  Thanks for coming in today.    Keeping You Healthy  Get These Tests  Blood Pressure- Have your blood pressure checked by your healthcare provider at least once a year.  Normal blood pressure is 120/80.  Weight- Have your body mass index (BMI) calculated to screen for obesity.  BMI is a measure of body fat based on height and weight.  You can calculate your own BMI at [employer]  Cholesterol- Have your cholesterol checked every year.  Diabetes- Have your blood sugar checked every year if you have high blood pressure, high cholesterol, a family history of diabetes or if you are overweight.  Pap Test - Have a pap test every 1 to 5 years if you have been sexually active.  If you are older than 65 and recent pap tests have been normal you may not need additional pap tests.  In addition, if you have had a hysterectomy  for benign disease additional pap tests are not necessary.  Mammogram-Yearly mammograms are essential for early detection of breast cancer  Screening for Colon Cancer- Colonoscopy starting at age 50. Screening may begin sooner depending on your family history and other health conditions.  Follow up colonoscopy as directed by your Gastroenterologist.  Screening for Osteoporosis- Screening begins at age 66 with bone density scanning, sooner if you are at higher risk for developing Osteoporosis.  Get these medicines  Calcium with Vitamin D- Your body requires 1200-1500 mg of Calcium a day and [phone] IU of Vitamin D a day.  You can only absorb 500 mg of Calcium at a time therefore Calcium must be taken in 2 or 3 separate doses throughout the day.  Hormones- Hormone therapy has been associated with increased risk for certain cancers and heart  disease.  Talk to your healthcare provider about if you need relief from menopausal symptoms.  Aspirin- Ask your healthcare provider about taking Aspirin to prevent Heart Disease and Stroke.  Get these Immuniztions  Flu shot- Every fall  Pneumonia shot- Once after the age of 55; if you are younger ask your healthcare provider if you need a pneumonia shot.  Tetanus- Every ten years.  Zostavax- Once after the age of 65 to prevent shingles.  Take these steps  Don't smoke- Your healthcare provider can help you quit. For tips on how to quit, ask your healthcare provider or go to www.smokefree.gov or call 1-800 QUIT-NOW.  Be physically active- Exercise 5 days a week for a minimum of 30 minutes.  If you are not already physically active, start slow and gradually work up to 30 minutes of moderate physical activity.  Try walking, dancing, bike riding, swimming, etc.  Eat a healthy diet- Eat a variety of healthy foods such as fruits, vegetables, whole grains, low fat milk, low fat cheeses, yogurt, lean meats, chicken, fish, eggs, dried beans, tofu, etc.  For more information go to www.thenutritionsource.org  Dental visit- Brush and floss teeth twice daily; visit your dentist twice a year.  Eye exam- Visit your Optometrist or Ophthalmologist yearly.  Drink alcohol in moderation- Limit alcohol intake to one drink or less a day.  Never drink and drive.  Depression- Your emotional health is as important as your physical health.  If you're  feeling down or losing interest in things you normally enjoy, please talk to your healthcare provider.  Seat Belts- can save your life; always wear one  Smoke/Carbon Monoxide detectors- These detectors need to be installed on the appropriate level of your home.  Replace batteries at least once a year.  Violence- If anyone is threatening or hurting you, please tell your healthcare provider.  Living Will/ Health care power of attorney- Discuss with your  healthcare provider and family.  IF you received an x-ray today, you will receive an invoice from [facility]. Please contact [facility] at [phone] with questions or concerns regarding your invoice.   IF you received labwork today, you will receive an invoice from [place]. Please contact [facility] at [phone] with questions or concerns regarding your invoice.   Our billing staff will not be able to assist you with questions regarding bills from these companies.  You will be contacted with the lab results as soon as they are available. The fastest way to get your results is to activate your My Chart account. Instructions are located on the last page of this paperwork. If you have not heard from [place] regarding the results in 2 weeks, please contact this office.

## 2018-03-05 ENCOUNTER — Encounter: Payer: Self-pay | Admitting: Family Medicine

## 2018-03-05 LAB — COMPREHENSIVE METABOLIC PANEL
ALT: 12 IU/L (ref 0–32)
AST: 16 IU/L (ref 0–40)
Albumin/Globulin Ratio: 2.5 — ABNORMAL HIGH (ref 1.2–2.2)
Albumin: 4.9 g/dL — ABNORMAL HIGH (ref 3.6–4.8)
Alkaline Phosphatase: 49 IU/L (ref 39–117)
BILIRUBIN TOTAL: 0.5 mg/dL (ref 0.0–1.2)
BUN/Creatinine Ratio: 16 (ref 12–28)
BUN: 17 mg/dL (ref 8–27)
CHLORIDE: 105 mmol/L (ref 96–106)
CO2: 25 mmol/L (ref 20–29)
Calcium: 9.9 mg/dL (ref 8.7–10.3)
Creatinine, Ser: 1.08 mg/dL — ABNORMAL HIGH (ref 0.57–1.00)
GFR calc non Af Amer: 55 mL/min/{1.73_m2} — ABNORMAL LOW (ref >59)
GFR, EST AFRICAN AMERICAN: 63 mL/min/{1.73_m2} (ref >59)
Globulin, Total: 2 g/dL (ref 1.5–4.5)
Glucose: 87 mg/dL (ref 65–99)
Potassium: 4.2 mmol/L (ref 3.5–5.2)
Sodium: 143 mmol/L (ref 134–144)
TOTAL PROTEIN: 6.9 g/dL (ref 6.0–8.5)

## 2018-03-05 LAB — TSH+FREE T4
FREE T4: 1.16 ng/dL (ref 0.82–1.77)
TSH: 5.5 u[IU]/mL — ABNORMAL HIGH (ref 0.450–4.500)

## 2018-03-05 LAB — LIPID PANEL
Chol/HDL Ratio: 2.9 ratio (ref 0.0–4.4)
Cholesterol, Total: 225 mg/dL — ABNORMAL HIGH (ref 100–199)
HDL: 77 mg/dL (ref >39)
LDL Calculated: 128 mg/dL — ABNORMAL HIGH (ref 0–99)
Triglycerides: 98 mg/dL (ref 0–149)
VLDL Cholesterol Cal: 20 mg/dL (ref 5–40)

## 2018-03-07 LAB — PAP IG W/ RFLX HPV ASCU: PAP Smear Comment: 0

## 2018-04-02 DIAGNOSIS — C44519 Basal cell carcinoma of skin of other part of trunk: Secondary | ICD-10-CM | POA: Diagnosis not present

## 2018-04-02 DIAGNOSIS — C44612 Basal cell carcinoma of skin of right upper limb, including shoulder: Secondary | ICD-10-CM | POA: Diagnosis not present

## 2018-07-19 ENCOUNTER — Other Ambulatory Visit: Payer: Self-pay | Admitting: Cardiology

## 2018-07-19 DIAGNOSIS — I471 Supraventricular tachycardia: Secondary | ICD-10-CM

## 2018-07-27 DIAGNOSIS — Z9221 Personal history of antineoplastic chemotherapy: Secondary | ICD-10-CM | POA: Diagnosis not present

## 2018-07-27 DIAGNOSIS — C329 Malignant neoplasm of larynx, unspecified: Secondary | ICD-10-CM | POA: Diagnosis not present

## 2018-07-27 DIAGNOSIS — Z923 Personal history of irradiation: Secondary | ICD-10-CM | POA: Diagnosis not present

## 2018-09-06 ENCOUNTER — Ambulatory Visit: Payer: Medicare Other | Admitting: Family Medicine

## 2018-09-17 ENCOUNTER — Other Ambulatory Visit: Payer: Self-pay | Admitting: Cardiology

## 2018-09-17 DIAGNOSIS — I471 Supraventricular tachycardia: Secondary | ICD-10-CM

## 2018-10-08 ENCOUNTER — Encounter: Payer: Self-pay | Admitting: Family Medicine

## 2018-10-08 ENCOUNTER — Ambulatory Visit (INDEPENDENT_AMBULATORY_CARE_PROVIDER_SITE_OTHER): Payer: Medicare Other | Admitting: Family Medicine

## 2018-10-08 ENCOUNTER — Other Ambulatory Visit: Payer: Self-pay

## 2018-10-08 VITALS — BP 97/64 | HR 61 | Temp 98.0°F | Resp 20 | Ht 63.19 in | Wt 125.6 lb

## 2018-10-08 DIAGNOSIS — R7989 Other specified abnormal findings of blood chemistry: Secondary | ICD-10-CM

## 2018-10-08 DIAGNOSIS — Z923 Personal history of irradiation: Secondary | ICD-10-CM | POA: Diagnosis not present

## 2018-10-08 DIAGNOSIS — Z23 Encounter for immunization: Secondary | ICD-10-CM | POA: Diagnosis not present

## 2018-10-08 DIAGNOSIS — C760 Malignant neoplasm of head, face and neck: Secondary | ICD-10-CM | POA: Diagnosis not present

## 2018-10-08 DIAGNOSIS — R946 Abnormal results of thyroid function studies: Secondary | ICD-10-CM | POA: Diagnosis not present

## 2018-10-08 NOTE — Patient Instructions (Addendum)
  Try to avoid NSAIDs as much as possible.  Tylenol may be safer for occasional pains.  Make sure to stay hydrated.  I will recheck your kidney function today.  I will also recheck an thyroid test.  Repeat testing in 6 months as long as it is stable today.  If you notice any new hair or skin changes, weight fluctuations, or temperature intolerance return sooner as that may be a sign that those levels have increased.  Thank you for coming today.  Recheck 40-month for physical   If you have lab work done today you will be contacted with your lab results within the next 2 weeks.  If you have not heard from [place] then please contact us. The fastest way to get your results is to register for My Chart.   IF you received an x-ray today, you will receive an invoice from [facility]. Please contact [facility] at [phone] with questions or concerns regarding your invoice.   IF you received labwork today, you will receive an invoice from [place]. Please contact [facility] at [phone] with questions or concerns regarding your invoice.   Our billing staff will not be able to assist you with questions regarding bills from these companies.  You will be contacted with the lab results as soon as they are available. The fastest way to get your results is to activate your My Chart account. Instructions are located on the last page of this paperwork. If you have not heard from [place] regarding the results in 2 weeks, please contact this office.

## 2018-10-08 NOTE — Progress Notes (Signed)
Subjective:    Patient ID: Dawn Gilbert, female    DOB: 1955-12-28, 62 y.o.   MRN: [id number]  HPI Dawn Gilbert is a 62 y.o. female Presents today for: Chief Complaint  Patient presents with  . THYROID    f/u    History of radiation for laryngeal cancer in 2015, followed by radiologist in [facility].  Has had ongoing monitoring for TSH at slight elevations.  When last seen in May did report some weight gain but denied fatigue palpitations heat or cold intolerance change in hair/skin.  TSH was elevated at 5.5 but normal free T4 at 1.16 when checked May 2.   No recent changes in hair,skin or temperature intolerance.  Wt Readings from Last 3 Encounters:  10/08/18 125 lb 9.6 oz (57 kg)  03/04/18 129 lb (58.5 kg)  09/04/17 123 lb 6 oz (56 kg)     Elevated creatinine: Borderline elevated with creatinine of 1.08, EGFR 55 when checked May 2. Feels like chemo affected kidney levels. Avoids NSAIDS usually, but rare advil.  Staying hydrated.  Urinating normally.   Hyperlipidemia:  Lab Results  Component Value Date   CHOL 225 (H) 03/04/2018   HDL 77 03/04/2018   LDLCALC 128 (H) 03/04/2018   TRIG 98 03/04/2018   CHOLHDL 2.9 03/04/2018   Lab Results  Component Value Date   ALT 12 03/04/2018   AST 16 03/04/2018   ALKPHOS 49 03/04/2018   BILITOT 0.5 03/04/2018   Health maintenance: Due for flu vaccine - given today.  otherwise up-to-date   Patient Active Problem List   Diagnosis Date Noted  . A-fib ([place]) 07/19/2015  . Elevated TSH 07/19/2015  . Cancer of larynx ([place]) 04/07/2014  . SVT (supraventricular tachycardia) ([place]) 01/04/2014  . Supraventricular tachycardia ([place]) 01/04/2014   Past Medical History:  Diagnosis Date  . Hoarseness   . Palpitations   . PONV (postoperative nausea and vomiting)    Past Surgical History:  Procedure Laterality Date  . DIRECT LARYNGOSCOPY Bilateral 01/06/2014   Procedure: DIRECT LARYNGOSCOPY with biopsy;  Surgeon: Ruby Cola, MD;   Location: [place];  Service: ENT;  Laterality: Bilateral;  . ESOPHAGOGASTRODUODENOSCOPY    . KNEE ARTHROSCOPY Bilateral   . MOUTH SURGERY    . TONSILLECTOMY    . tumor removed from left ovary     Allergies  Allergen Reactions  . Penicillins Hives   Prior to Admission medications   Medication Sig Start Date End Date Taking? Authorizing Provider  ibuprofen (ADVIL,MOTRIN) 200 MG tablet Take 600 mg by mouth every 6 (six) hours as needed for moderate pain.   Yes [provider]  metoprolol succinate (TOPROL-XL) 25 MG 24 hr tablet TAKE 1 TABLET DAILY (PLEASE SCHEDULE APPOINTMENT BEFORE FURTHER REFILLS ARE ORDERED) 09/17/18  Yes Crenshaw, Denice Bors, MD  PREVIDENT 5000 BOOSTER PLUS 1.1 % PSTE Take 1 application by mouth at bedtime. 05/09/15  Yes [provider]   Social History   Socioeconomic History  . Marital status: Married    Spouse name: Not on file  . Number of children: 2  . Years of education: Not on file  . Highest education level: Not on file  Occupational History  . Occupation: Stage manager: [employer]  Social Needs  . Financial resource strain: Not on file  . Food insecurity:    Worry: Not on file    Inability: Not on file  . Transportation needs:    Medical: Not on  file    Non-medical: Not on file  Tobacco Use  . Smoking status: Former Smoker    Packs/day: 0.50    Years: 25.00    Pack years: 12.50    Types: Cigarettes    Last attempt to quit: 01/06/2014    Years since quitting: 4.7  . Smokeless tobacco: Never Used  Substance and Sexual Activity  . Alcohol use: Yes    Alcohol/week: 0.0 standard drinks    Comment: Rare use  . Drug use: No  . Sexual activity: Yes    Birth control/protection: Post-menopausal  Lifestyle  . Physical activity:    Days per week: Not on file    Minutes per session: Not on file  . Stress: Not on file  Relationships  . Social connections:    Talks on phone: Not on file    Gets together: Not on  file    Attends religious service: Not on file    Active member of club or organization: Not on file    Attends meetings of clubs or organizations: Not on file    Relationship status: Not on file  . Intimate partner violence:    Fear of current or ex partner: Not on file    Emotionally abused: Not on file    Physically abused: Not on file    Forced sexual activity: Not on file  Other Topics Concern  . Not on file  Social History Narrative   Lives with husband in [place]. 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    Review of Systems Per HPI    Objective:   Physical Exam  Constitutional: She is oriented to person, place, and time. She appears well-developed and well-nourished.  HENT:  Head: Normocephalic and atraumatic.  Eyes: Pupils are equal, round, and reactive to light. Conjunctivae and EOM are normal.  Neck: Neck supple. Carotid bruit is not present. No tracheal deviation present. No thyromegaly present.  Cardiovascular: Normal rate, regular rhythm, normal heart sounds and intact distal pulses.  Pulmonary/Chest: Effort normal and breath sounds normal.  Abdominal: Soft. She exhibits no pulsatile midline mass. There is no tenderness.  Lymphadenopathy:    She has no cervical adenopathy.  Neurological: She is alert and oriented to person, place, and time.  Skin: Skin is warm and dry.  Psychiatric: She has a normal mood and affect. Her behavior is normal.  Vitals reviewed.  Vitals:   10/08/18 1044  BP: 97/64  Pulse: 61  Resp: 20  Temp: 98 F (36.7 C)  TempSrc: Oral  SpO2: 98%  Weight: 125 lb 9.6 oz (57 kg)  Height: 5' 3.19" (1.605 m)       Assessment & Plan:  Dawn Gilbert is a 62 y.o. female Elevated TSH - Plan: TSH + free T4 History of head and neck radiation - Plan: TSH + free T4  - borderline tsh prior, with history of neck radiation.  Continue to monitor for subclinical hypothyroidism.  Warning signs of new symptoms discussed for early return otherwise recheck 6  months  Need for prophylactic vaccination and inoculation against influenza - Plan: Flu Vaccine QUAD 36+ mos IM  Elevated serum creatinine - Plan: Basic metabolic panel  -Repeat to verify stability, avoid nephrotoxins including NSAIDs as much as possible  No orders of the defined types were placed in this encounter.  Patient Instructions    Try to avoid NSAIDs as much as possible.  Tylenol may be safer for occasional pains.  Make sure to stay hydrated.  I will recheck your kidney function today.  I will also recheck an thyroid test.  Repeat testing in 6 months as long as it is stable today.  If you notice any new hair or skin changes, weight fluctuations, or temperature intolerance return sooner as that may be a sign that those levels have increased.  Thank you for coming today.  Recheck 27-monthfor physical   If you have lab work done today you will be contacted with your lab results within the next 2 weeks.  If you have not heard from [place] please contact [place] The fastest way to get your results is to register for My Chart.   IF you received an x-ray today, you will receive an invoice from [facility]. Please contact [facility] at [phone] questions or concerns regarding your invoice.   IF you received labwork today, you will receive an invoice from [facility] Please contact [facility] at [phone] questions or concerns regarding your invoice.   Our billing staff will not be able to assist you with questions regarding bills from these companies.  You will be contacted with the lab results as soon as they are available. The fastest way to get your results is to activate your My Chart account. Instructions are located on the last page of this paperwork. If you have not heard from [place] the results in 2 weeks, please contact this office.      Signed,   JMerri Ray MD [facility]  10/08/18 11:17  AM

## 2018-10-09 LAB — BASIC METABOLIC PANEL
BUN/Creatinine Ratio: 14 (ref 12–28)
BUN: 15 mg/dL (ref 8–27)
CALCIUM: 9.9 mg/dL (ref 8.7–10.3)
CO2: 25 mmol/L (ref 20–29)
Chloride: 100 mmol/L (ref 96–106)
Creatinine, Ser: 1.05 mg/dL — ABNORMAL HIGH (ref 0.57–1.00)
GFR calc Af Amer: 65 mL/min/{1.73_m2} (ref >59)
GFR, EST NON AFRICAN AMERICAN: 57 mL/min/{1.73_m2} — AB (ref >59)
Glucose: 90 mg/dL (ref 65–99)
POTASSIUM: 4.2 mmol/L (ref 3.5–5.2)
Sodium: 140 mmol/L (ref 134–144)

## 2018-10-09 LAB — TSH+FREE T4
Free T4: 1.27 ng/dL (ref 0.82–1.77)
TSH: 4.34 u[IU]/mL (ref 0.450–4.500)

## 2018-10-30 ENCOUNTER — Other Ambulatory Visit (HOSPITAL_BASED_OUTPATIENT_CLINIC_OR_DEPARTMENT_OTHER): Payer: Self-pay | Admitting: Family Medicine

## 2018-10-30 DIAGNOSIS — Z1231 Encounter for screening mammogram for malignant neoplasm of breast: Secondary | ICD-10-CM

## 2018-11-02 ENCOUNTER — Other Ambulatory Visit (HOSPITAL_BASED_OUTPATIENT_CLINIC_OR_DEPARTMENT_OTHER): Payer: Self-pay | Admitting: Family Medicine

## 2018-11-02 DIAGNOSIS — Z1231 Encounter for screening mammogram for malignant neoplasm of breast: Secondary | ICD-10-CM

## 2018-11-05 ENCOUNTER — Ambulatory Visit (HOSPITAL_BASED_OUTPATIENT_CLINIC_OR_DEPARTMENT_OTHER)
Admission: RE | Admit: 2018-11-05 | Discharge: 2018-11-05 | Disposition: A | Discharge status: Home / Self Care | Payer: Medicare Other | Source: Ambulatory Visit | Attending: Body Imaging | Admitting: Body Imaging

## 2018-11-05 DIAGNOSIS — Z1231 Encounter for screening mammogram for malignant neoplasm of breast: Secondary | ICD-10-CM | POA: Insufficient documentation

## 2018-11-08 ENCOUNTER — Other Ambulatory Visit: Payer: Self-pay | Admitting: Family Medicine

## 2018-11-08 DIAGNOSIS — R928 Other abnormal and inconclusive findings on diagnostic imaging of breast: Secondary | ICD-10-CM

## 2018-11-12 ENCOUNTER — Ambulatory Visit
Admission: RE | Admit: 2018-11-12 | Discharge: 2018-11-12 | Disposition: A | Discharge status: Home / Self Care | Payer: Medicare Other | Source: Ambulatory Visit | Attending: Family Medicine | Admitting: Family Medicine

## 2018-11-12 DIAGNOSIS — R928 Other abnormal and inconclusive findings on diagnostic imaging of breast: Secondary | ICD-10-CM

## 2018-11-12 DIAGNOSIS — N6489 Other specified disorders of breast: Secondary | ICD-10-CM | POA: Diagnosis not present

## 2018-11-12 DIAGNOSIS — R922 Inconclusive mammogram: Secondary | ICD-10-CM | POA: Diagnosis not present

## 2018-11-16 NOTE — Progress Notes (Signed)
HPI: Follow-up SVT. Patient was noted to have wide complex tachycardia in March 2015. This was felt to be SVT with aberrancy. Carotid massage converted the patient to atrial fibrillation. She was given Cardizem and then converted to sinus rhythm. Beta blockade added. Echocardiogram March 2015 showed normal LV systolic function and mild to moderate mitral regurgitation. Since last seen, there is no dyspnea on exertion, orthopnea, PND, pedal edema or syncope.  She has rare episodes of SVT that she can terminate with squatting.  These last approximately 30 seconds and there are no associated symptoms.  Current Outpatient Medications  Medication Sig Dispense Refill  . ibuprofen (ADVIL,MOTRIN) 200 MG tablet Take 600 mg by mouth every 6 (six) hours as needed for moderate pain.    . metoprolol succinate (TOPROL-XL) 25 MG 24 hr tablet TAKE 1 TABLET DAILY (PLEASE SCHEDULE APPOINTMENT BEFORE FURTHER REFILLS ARE ORDERED) 30 tablet 1  . PREVIDENT 5000 BOOSTER PLUS 1.1 % PSTE Take 1 application by mouth at bedtime.  11   No current facility-administered medications for this visit.      Past Medical History:  Diagnosis Date  . Hoarseness   . Palpitations   . PONV (postoperative nausea and vomiting)     Past Surgical History:  Procedure Laterality Date  . DIRECT LARYNGOSCOPY Bilateral 01/06/2014   Procedure: DIRECT LARYNGOSCOPY with biopsy;  Surgeon: Ruby Cola, MD;  Location: [place];  Service: ENT;  Laterality: Bilateral;  . ESOPHAGOGASTRODUODENOSCOPY    . KNEE ARTHROSCOPY Bilateral   . MOUTH SURGERY    . TONSILLECTOMY    . tumor removed from left ovary      Social History   Socioeconomic History  . Marital status: Married    Spouse name: Not on file  . Number of children: 2  . Years of education: Not on file  . Highest education level: Not on file  Occupational History  . 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 Financial resource strain: Not  on file  . Food insecurity:    Worry: Not on file    Inability: Not on file  . Transportation needs:    Medical: Not on file    Non-medical: Not on file  Tobacco Use  . Smoking status: Former Smoker    Packs/day: 0.50    Years: 25.00    Pack years: 12.50    Types: Cigarettes    Last attempt to quit: 01/06/2014    Years since quitting: 4.8  . Smokeless tobacco: Never Used  Substance and Sexual Activity  . Alcohol use: Yes    Alcohol/week: 0.0 standard drinks    Comment: Rare use  . Drug use: No  . Sexual activity: Yes    Birth control/protection: Post-menopausal  Lifestyle  . Physical activity:    Days per week: Not on file    Minutes per session: Not on file  . Stress: Not on file  Relationships  . Social connections:    Talks on phone: Not on file    Gets together: Not on file    Attends religious service: Not on file    Active member of club or organization: Not on file    Attends meetings of clubs or organizations: Not on file    Relationship status: Not on file  . Intimate partner violence:    Fear of current or ex partner: Not on file    Emotionally abused: Not on file  Physically abused: Not on file    Forced sexual activity: Not on file  Other Topics Concern  . Not on file  Social History Narrative   Lives with husband in [place]. 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    Family History  Problem Relation Age of Onset  . Cancer Mother   . Cancer Father   . Breast cancer Neg Hx     ROS: no fevers or chills, productive cough, hemoptysis, dysphasia, odynophagia, melena, hematochezia, dysuria, hematuria, rash, seizure activity, orthopnea, PND, pedal edema, claudication. Remaining systems are negative.  Physical Exam: Well-developed well-nourished in no acute distress.  Skin is warm and dry.  HEENT is normal.  Neck is supple.  Chest is clear to auscultation with normal expansion.  Cardiovascular exam is regular rate and rhythm.  Abdominal exam nontender or  distended. No masses palpated. Extremities show no edema. neuro grossly intact  ECG-sinus rhythm with occasional PAC, normal axis, no significant ST changes.  Personally reviewed  A/P  1 supraventricular tachycardia-patient has rare brief episodes of SVT that she can control with squatting.  Continue present dose of beta-blocker.  We can consider referral for ablation if she has frequent, prolonged episodes in the future despite medical therapy.  2 history of mild to moderate mitral regurgitation-repeat echocardiogram.  3 history of laryngeal cancer-follow-up oncology.  Kirk Ruths, MD

## 2018-11-22 ENCOUNTER — Encounter: Payer: Self-pay | Admitting: Cardiology

## 2018-11-22 ENCOUNTER — Ambulatory Visit (INDEPENDENT_AMBULATORY_CARE_PROVIDER_SITE_OTHER): Payer: Medicare Other | Admitting: Cardiology

## 2018-11-22 VITALS — BP 124/62 | HR 78 | Ht 63.0 in | Wt 125.4 lb

## 2018-11-22 DIAGNOSIS — I471 Supraventricular tachycardia: Secondary | ICD-10-CM | POA: Diagnosis not present

## 2018-11-22 DIAGNOSIS — I059 Rheumatic mitral valve disease, unspecified: Secondary | ICD-10-CM

## 2018-11-22 NOTE — Patient Instructions (Signed)
Medication Instructions:  NO CHANGE If you need a refill on your cardiac medications before your next appointment, please call your pharmacy.   Lab work: If you have labs (blood work) drawn today and your tests are completely normal, you will receive your results only by: Marland Kitchen MyChart Message (if you have MyChart) OR . A paper copy in the mail If you have any lab test that is abnormal or we need to change your treatment, we will call you to review the results.  Testing/Procedures: Your physician has requested that you have an echocardiogram. Echocardiography is a painless test that uses sound waves to create images of your heart. It provides your doctor with information about the size and shape of your heart and how well your heart's chambers and valves are working. This procedure takes approximately one hour. There are no restrictions for this procedure.  Pray  Follow-Up: At [facility], you and your health needs are our priority.  As part of our continuing mission to provide you with exceptional heart care, we have created designated Provider Care Teams.  These Care Teams include your primary Cardiologist (physician) and Advanced Practice Providers (APPs -  Physician Assistants and Nurse Practitioners) who all work together to provide you with the care you need, when you need it. You will need a follow up appointment in 12 months.  Please call our office 2 months in advance to schedule this appointment.  You may see Kirk Ruths MD or one of the following Advanced Practice Providers on your designated Care Team:   Kerin Ransom, PA-C Roby Lofts, [place] . Sande Rives, [place] CALL IN November TO SCHEDULE APPOINTMENT IN January 2021

## 2018-11-22 NOTE — Addendum Note (Signed)
Addended by: Diana Eves on: 11/22/2018 09:53 AM   Modules accepted: Orders

## 2018-12-03 ENCOUNTER — Ambulatory Visit (HOSPITAL_COMMUNITY): Payer: Medicare Other | Attending: Cardiovascular Disease

## 2018-12-03 DIAGNOSIS — I059 Rheumatic mitral valve disease, unspecified: Secondary | ICD-10-CM | POA: Insufficient documentation

## 2018-12-06 ENCOUNTER — Other Ambulatory Visit: Payer: Self-pay | Admitting: Cardiology

## 2018-12-06 DIAGNOSIS — I471 Supraventricular tachycardia: Secondary | ICD-10-CM

## 2019-02-01 ENCOUNTER — Telehealth: Payer: Self-pay | Admitting: *Deleted

## 2019-02-03 NOTE — Telephone Encounter (Signed)
ERROR

## 2019-02-08 ENCOUNTER — Other Ambulatory Visit: Payer: Self-pay

## 2019-02-08 ENCOUNTER — Ambulatory Visit (INDEPENDENT_AMBULATORY_CARE_PROVIDER_SITE_OTHER): Payer: Medicare Other | Admitting: Family Medicine

## 2019-02-08 VITALS — Ht 63.5 in | Wt 126.0 lb

## 2019-02-08 DIAGNOSIS — Z0000 Encounter for general adult medical examination without abnormal findings: Secondary | ICD-10-CM

## 2019-02-08 NOTE — Progress Notes (Signed)
Presents today for [employer] Visit    ADVANCE DIRECTIVES: Discussed: yes On File: no Materials Provided: yes  Immunization status:  Immunization History  Administered Date(s) Administered  . Influenza,inj,Quad PF,6+ Mos 09/04/2017, 10/08/2018     There are no preventive care reminders to display for this patient.   Functional Status Survey: Is the patient deaf or have difficulty hearing?: No Does the patient have difficulty seeing, even when wearing glasses/contacts?: No Does the patient have difficulty concentrating, remembering, or making decisions?: No Does the patient have difficulty walking or climbing stairs?: No Does the patient have difficulty dressing or bathing?: No Does the patient have difficulty doing errands alone such as visiting a doctor's office or shopping?: No   6CIT Screen 02/08/2019 09/04/2017  What Year? 0 points 0 points  What month? - 0 points  What time? 0 points 0 points  Count back from 20 0 points 0 points  Months in reverse 0 points 0 points  Repeat phrase 0 points 2 points  Total Score - 2        Clinical Support from 02/08/2019 in [facility] at [facility]  AUDIT-C Score  0         Patient Active Problem List   Diagnosis Date Noted  . A-fib ([place]) 07/19/2015  . Elevated TSH 07/19/2015  . Cancer of larynx ([place]) 04/07/2014  . SVT (supraventricular tachycardia) ([place]) 01/04/2014  . Supraventricular tachycardia ([place]) 01/04/2014     Past Medical History:  Diagnosis Date  . Hoarseness   . Palpitations   . PONV (postoperative nausea and vomiting)      Past Surgical History:  Procedure Laterality Date  . DIRECT LARYNGOSCOPY Bilateral 01/06/2014   Procedure: DIRECT LARYNGOSCOPY with biopsy;  Surgeon: Ruby Cola, MD;  Location: [place];  Service: ENT;  Laterality: Bilateral;  . ESOPHAGOGASTRODUODENOSCOPY    . KNEE ARTHROSCOPY Bilateral   . MOUTH SURGERY    . TONSILLECTOMY    . tumor removed from left ovary       Family History  Problem Relation Age of Onset  . Cancer Mother   . Cancer Father   . Breast cancer Neg Hx      Social History   Socioeconomic History  . Marital status: Married    Spouse name: Not on file  . Number of children: 2  . Years of education: Not on file  . Highest education level: Not on file  Occupational History  . 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 Financial resource strain: Not on file  . Food insecurity:    Worry: Not on file    Inability: Not on file  . Transportation needs:    Medical: Not on file    Non-medical: Not on file  Tobacco Use  . Smoking status: Former Smoker    Packs/day: 0.50    Years: 25.00    Pack years: 12.50    Types: Cigarettes    Last attempt to quit: 01/06/2014    Years since quitting: 5.0  . Smokeless tobacco: Never Used  Substance and Sexual Activity  . Alcohol use: Yes    Alcohol/week: 0.0 standard drinks    Comment: Rare use  . Drug use: No  . Sexual activity: Yes    Birth control/protection: Post-menopausal  Lifestyle  . Physical activity:    Days per week: Not on file    Minutes per session: Not on file  . Stress: Not  on file  Relationships  . Social connections:    Talks on phone: Not on file    Gets together: Not on file    Attends religious service: Not on file    Active member of club or organization: Not on file    Attends meetings of clubs or organizations: Not on file    Relationship status: Not on file  . Intimate partner violence:    Fear of current or ex partner: Not on file    Emotionally abused: Not on file    Physically abused: Not on file    Forced sexual activity: Not on file  Other Topics Concern  . Not on file  Social History Narrative   Lives with husband in [place]. 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     Allergies  Allergen Reactions  . Penicillins Hives     Prior to Admission medications   Medication Sig Start Date End Date Taking? Authorizing  Provider  ibuprofen (ADVIL,MOTRIN) 200 MG tablet Take 600 mg by mouth every 6 (six) hours as needed for moderate pain.   Yes [provider]  metoprolol succinate (TOPROL-XL) 25 MG 24 hr tablet Take 1 tablet (25 mg total) by mouth daily. 12/07/18  Yes Lelon Perla, MD  PREVIDENT 5000 BOOSTER PLUS 1.1 % PSTE Take 1 application by mouth at bedtime. 05/09/15  Yes [provider]     Depression screen [facility] 2/9 02/08/2019 10/08/2018 03/04/2018 09/04/2017 06/16/2017  Decreased Interest 0 0 0 0 0  Down, Depressed, Hopeless 0 0 0 0 0  PHQ - 2 Score 0 0 0 0 0  Altered sleeping 0 - - - -  Tired, decreased energy 0 - - - -  Change in appetite 0 - - - -  Feeling bad or failure about yourself  0 - - - -  Trouble concentrating 0 - - - -  Moving slowly or fidgety/restless 0 - - - -  PHQ-9 Score 0 - - - -  Difficult doing work/chores Not difficult at all - - - -     Fall Risk  02/08/2019 10/08/2018 03/04/2018 09/04/2017 06/16/2017  Falls in the past year? 0 0 No No No  Number falls in past yr: 0 - - - -  Injury with Fall? 0 - - - -      PHYSICAL EXAM: Ht 5' 3.5" (1.613 m)   Wt 126 lb (57.2 kg)   BMI 21.97 kg/m    Wt Readings from Last 3 Encounters:  02/08/19 126 lb (57.2 kg)  11/22/18 125 lb 6.4 oz (56.9 kg)  10/08/18 125 lb 9.6 oz (57 kg)     No exam data present    Physical Exam   Education/Counseling provided regarding diet and exercise, prevention of chronic diseases, smoking/tobacco cessation, if applicable, and reviewed "Covered Medicare Preventive Services."   ASSESSMENT/PLAN: There are no diagnoses linked to this encounter.     Lives story home with husband dog, and three cats  No trouble climbing stairs  No trouble getting chairs  No scattered rugs No grab bars  Well lit.   Mailed Advanced Directives   Shingrix discussed

## 2019-02-08 NOTE — Patient Instructions (Addendum)
Healthy Eating Following a healthy eating pattern may help you to achieve and maintain a healthy body weight, reduce the risk of chronic disease, and live a long and productive life. It is important to follow a healthy eating pattern at an appropriate calorie level for your body. Your nutritional needs should be met primarily through food by choosing a variety of nutrient-rich foods. What are tips for following this plan? Reading food labels  Read labels and choose the following: ? Reduced or low sodium. ? Juices with 100% fruit juice. ? Foods with low saturated fats and high polyunsaturated and monounsaturated fats. ? Foods with whole grains, such as whole wheat, cracked wheat, brown rice, and wild rice. ? Whole grains that are fortified with folic acid. This is recommended for women who are pregnant or who want to become pregnant.  Read labels and avoid the following: ? Foods with a lot of added sugars. These include foods that contain brown sugar, corn sweetener, corn syrup, dextrose, fructose, glucose, high-fructose corn syrup, honey, invert sugar, lactose, malt syrup, maltose, molasses, raw sugar, sucrose, trehalose, or turbinado sugar.  Do not eat more than the following amounts of added sugar per day:  6 teaspoons (25 g) for women.  9 teaspoons (38 g) for men. ? Foods that contain processed or refined starches and grains. ? Refined grain products, such as white flour, degermed cornmeal, white bread, and white rice. Shopping  Choose nutrient-rich snacks, such as vegetables, whole fruits, and nuts. Avoid high-calorie and high-sugar snacks, such as potato chips, fruit snacks, and candy.  Use oil-based dressings and spreads on foods instead of solid fats such as butter, stick margarine, or cream cheese.  Limit pre-made sauces, mixes, and "instant" products such as flavored rice, instant noodles, and ready-made pasta.  Try more plant-protein sources, such as tofu, tempeh, black beans,  edamame, lentils, nuts, and seeds.  Explore eating plans such as the Mediterranean diet or vegetarian diet. Cooking  Use oil to saut or stir-fry foods instead of solid fats such as butter, stick margarine, or lard.  Try baking, boiling, grilling, or broiling instead of frying.  Remove the fatty part of meats before cooking.  Steam vegetables in water or broth. Meal planning   At meals, imagine dividing your plate into fourths: ? One-half of your plate is fruits and vegetables. ? One-fourth of your plate is whole grains. ? One-fourth of your plate is protein, especially lean meats, poultry, eggs, tofu, beans, or nuts.  Include low-fat dairy as part of your daily diet. Lifestyle  Choose healthy options in all settings, including home, work, school, restaurants, or stores.  Prepare your food safely: ? Wash your hands after handling raw meats. ? Keep food preparation surfaces clean by regularly washing with hot, soapy water. ? Keep raw meats separate from ready-to-eat foods, such as fruits and vegetables. ? Cook seafood, meat, poultry, and eggs to the recommended internal temperature. ? Store foods at safe temperatures. In general:  Keep cold foods at 59F (4.4C) or below.  Keep hot foods at 159F (60C) or above.  Keep your freezer at [facility] (-17.8C) or below.  Foods are no longer safe to eat when they have been between the temperatures of 40-159F (4.4-60C) for more than 2 hours. What foods should I eat? Fruits Aim to eat 2 cup-equivalents of fresh, canned (in natural juice), or frozen fruits each day. Examples of 1 cup-equivalent of fruit include 1 small apple, 8 large strawberries, 1 cup canned fruit,  cup  dried fruit, or 1 cup 100% juice. Vegetables Aim to eat 2-3 cup-equivalents of fresh and frozen vegetables each day, including different varieties and colors. Examples of 1 cup-equivalent of vegetables include 2 medium carrots, 2 cups raw, leafy greens, 1 cup chopped  vegetable (raw or cooked), or 1 medium baked potato. Grains Aim to eat 6 ounce-equivalents of whole grains each day. Examples of 1 ounce-equivalent of grains include 1 slice of bread, 1 cup ready-to-eat cereal, 3 cups popcorn, or  cup cooked rice, pasta, or cereal. Meats and other proteins Aim to eat 5-6 ounce-equivalents of protein each day. Examples of 1 ounce-equivalent of protein include 1 egg, 1/2 cup nuts or seeds, or 1 tablespoon (16 g) peanut butter. A cut of meat or fish that is the size of a deck of cards is about 3-4 ounce-equivalents.  Of the protein you eat each week, try to have at least 8 ounces come from seafood. This includes salmon, trout, herring, and anchovies. Dairy Aim to eat 3 cup-equivalents of fat-free or low-fat dairy each day. Examples of 1 cup-equivalent of dairy include 1 cup (240 mL) milk, 8 ounces (250 g) yogurt, 1 ounces (44 g) natural cheese, or 1 cup (240 mL) fortified soy milk. Fats and oils  Aim for about 5 teaspoons (21 g) per day. Choose monounsaturated fats, such as canola and olive oils, avocados, peanut butter, and most nuts, or polyunsaturated fats, such as sunflower, corn, and soybean oils, walnuts, pine nuts, sesame seeds, sunflower seeds, and flaxseed. Beverages  Aim for six 8-oz glasses of water per day. Limit coffee to three to five 8-oz cups per day.  Limit caffeinated beverages that have added calories, such as soda and energy drinks.  Limit alcohol intake to no more than 1 drink a day for nonpregnant women and 2 drinks a day for men. One drink equals 12 oz of beer (355 mL), 5 oz of wine (148 mL), or 1 oz of hard liquor (44 mL). Seasoning and other foods  Avoid adding excess amounts of salt to your foods. Try flavoring foods with herbs and spices instead of salt.  Avoid adding sugar to foods.  Try using oil-based dressings, sauces, and spreads instead of solid fats. This information is based on general [place] nutrition guidelines. For more  information, visit [employer]. Exact amounts may vary based on your nutrition needs. Summary  A healthy eating plan may help you to maintain a healthy weight, reduce the risk of chronic diseases, and stay active throughout your life.  Plan your meals. Make sure you eat the right portions of a variety of nutrient-rich foods.  Try baking, boiling, grilling, or broiling instead of frying.  Choose healthy options in all settings, including home, work, school, restaurants, or stores. This information is not intended to replace advice given to you by your health care provider. Make sure you discuss any questions you have with your health care provider. Document Released: 02/01/2018 Document Revised: 02/01/2018 Document Reviewed: 02/01/2018 Elsevier Interactive Patient Education  2019 [place] Directive  Advance directives are legal documents that let you make choices ahead of time about your health care and medical treatment in case you become unable to communicate for yourself. Advance directives are a way for you to communicate your wishes to family, friends, and health care providers. This can help convey your decisions about end-of-life care if you become unable to communicate. Discussing and writing advance directives should happen over time rather than all at once. Advance directives can  Advance directives are legal documents that let you make choices ahead of time about your health care and medical treatment in case you become unable to communicate for yourself. Advance directives are a way for you to communicate your wishes to family, friends, and health care providers. This can help convey your decisions about end-of-life care if you become unable to communicate.  Discussing and writing advance directives should happen over time rather than all at once. Advance directives can be changed depending on your situation and what you want, even after you have signed the advance directives.  If you do not have an advance directive, some states assign family decision makers to act on your behalf based on how closely you are related to them. Each state has its own laws regarding advance directives. You may want to check with your health care provider, attorney, or state representative about the laws in your state. There are different types of advance directives, such as:  · Medical power of attorney.  · Living will.  · Do not resuscitate (DNR) or do not attempt resuscitation  (DNAR) order.  Health care proxy and medical power of attorney  A health care proxy, also called a health care agent, is a person who is appointed to make medical decisions for you in cases in which you are unable to make the decisions yourself. Generally, people choose someone they know well and trust to represent their preferences. Make sure to ask this person for an agreement to act as your proxy. A proxy may have to exercise judgment in the event of a medical decision for which your wishes are not known.  A medical power of attorney is a legal document that names your health care proxy. Depending on the laws in your state, after the document is written, it may also need to be:  · Signed.  · Notarized.  · Dated.  · Copied.  · Witnessed.  · Incorporated into your medical record.  You may also want to appoint someone to manage your financial affairs in a situation in which you are unable to do so. This is called a durable power of attorney for finances. It is a separate legal document from the durable power of attorney for health care. You may choose the same person or someone different from your health care proxy to act as your agent in financial matters.  If you do not appoint a proxy, or if there is a concern that the proxy is not acting in your best interests, a court-appointed guardian may be designated to act on your behalf.  Living will  A living will is a set of instructions documenting your wishes about medical care when you cannot express them yourself. Health care providers should keep a copy of your living will in your medical record. You may want to give a copy to family members or friends. To alert caregivers in case of an emergency, you can place a card in your wallet to let them know that you have a living will and where they can find it. A living will is used if you become:  · Terminally ill.  · Incapacitated.  · Unable to communicate or make decisions.  Items to consider in your living will  include:  · The use or non-use of life-sustaining equipment, such as dialysis machines and breathing machines (ventilators).  · A DNR or DNAR order, which is the instruction not to use cardiopulmonary resuscitation (CPR) if breathing or heartbeat stops.  ·   The use or non-use of tube feeding.  · Withholding of food and fluids.  · Comfort (palliative) care when the goal becomes comfort rather than a cure.  · Organ and tissue donation.  A living will does not give instructions for distributing your money and property if you should pass away. It is recommended that you seek the advice of a lawyer when writing a will. Decisions about taxes, beneficiaries, and asset distribution will be legally binding. This process can relieve your family and friends of any concerns surrounding disputes or questions that may come up about the distribution of your assets.  DNR or DNAR  A DNR or DNAR order is a request not to have CPR in the event that your heart stops beating or you stop breathing. If a DNR or DNAR order has not been made and shared, a health care provider will try to help any patient whose heart has stopped or who has stopped breathing. If you plan to have surgery, talk with your health care provider about how your DNR or DNAR order will be followed if problems occur.  Summary  · Advance directives are the legal documents that allow you to make choices ahead of time about your health care and medical treatment in case you become unable to communicate for yourself.  · The process of discussing and writing advance directives should happen over time. You can change the advance directives, even after you have signed them.  · Advance directives include DNR or DNAR orders, living wills, and designating an agent as your medical power of attorney.  This information is not intended to replace advice given to you by your health care provider. Make sure you discuss any questions you have with your health care provider.  Document  Released: 01/27/2008 Document Revised: 09/08/2016 Document Reviewed: 09/08/2016  Elsevier Interactive Patient Education © 2019 Elsevier Inc.

## 2019-07-15 ENCOUNTER — Other Ambulatory Visit: Payer: Self-pay | Admitting: *Deleted

## 2019-07-15 DIAGNOSIS — Z20822 Contact with and (suspected) exposure to covid-19: Secondary | ICD-10-CM

## 2019-07-17 LAB — NOVEL CORONAVIRUS, NAA: SARS-CoV-2, NAA: NOT DETECTED

## 2019-08-11 IMAGING — MG DIGITAL SCREENING BILATERAL MAMMOGRAM WITH CAD
4 series · 4 of 4 positions shown · non-contrast
Comparison: None.

CLINICAL DATA: Screening.

EXAM:
DIGITAL SCREENING BILATERAL MAMMOGRAM WITH CAD

[L MLO]
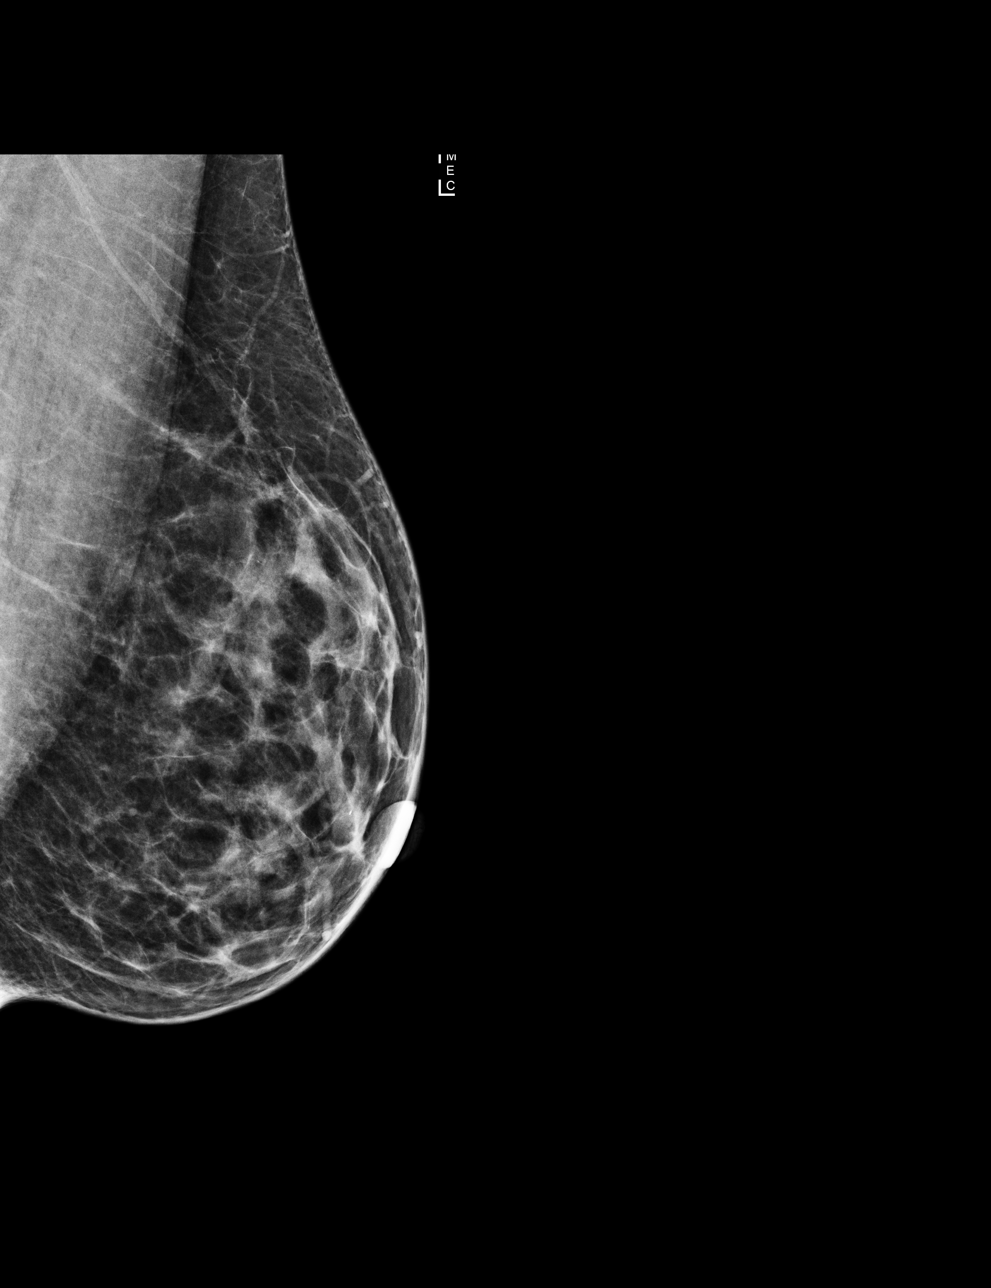

[L CC]
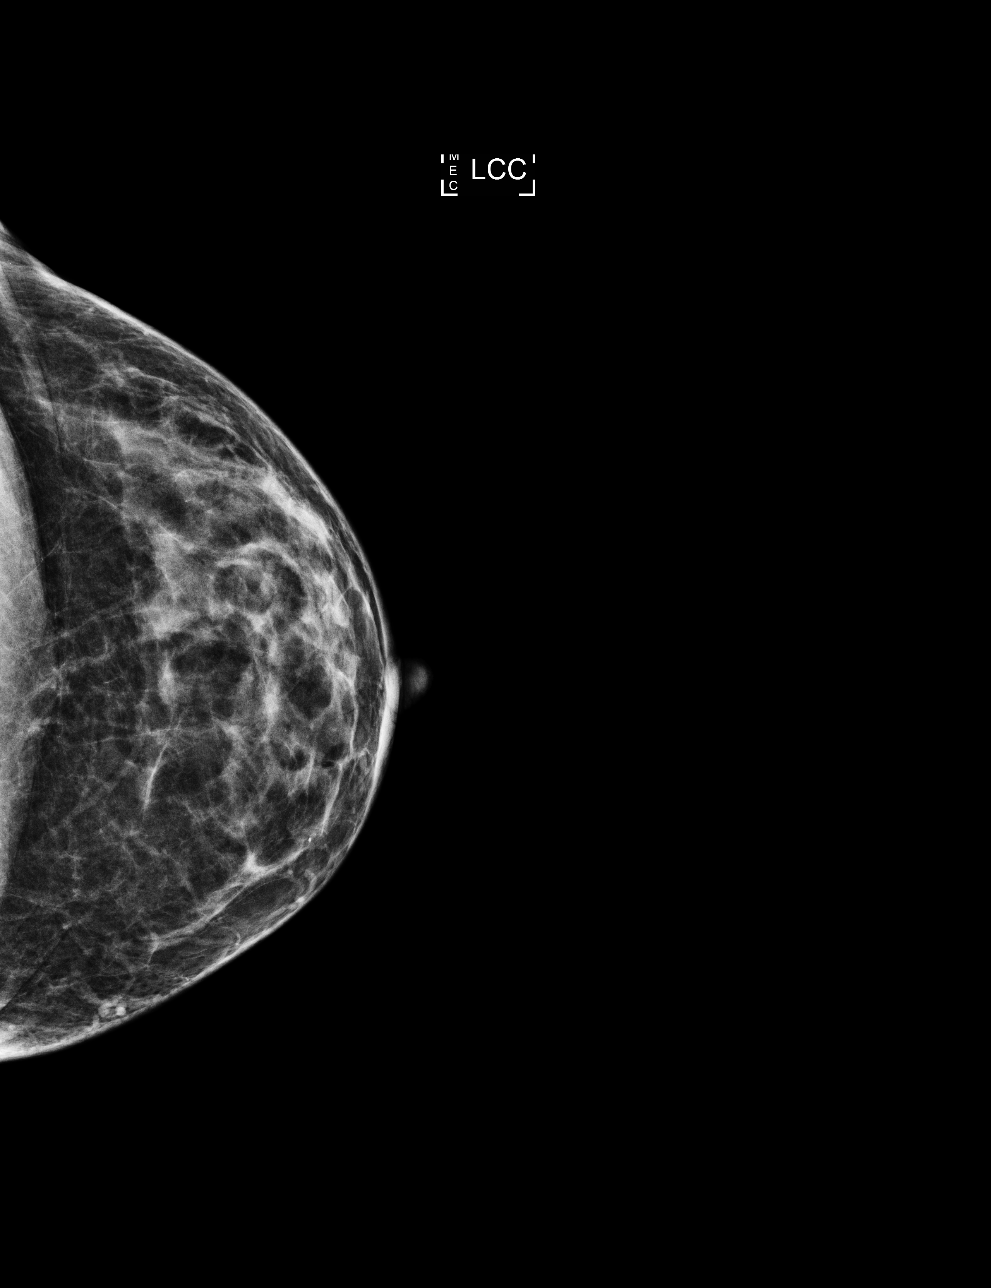

[R MLO]
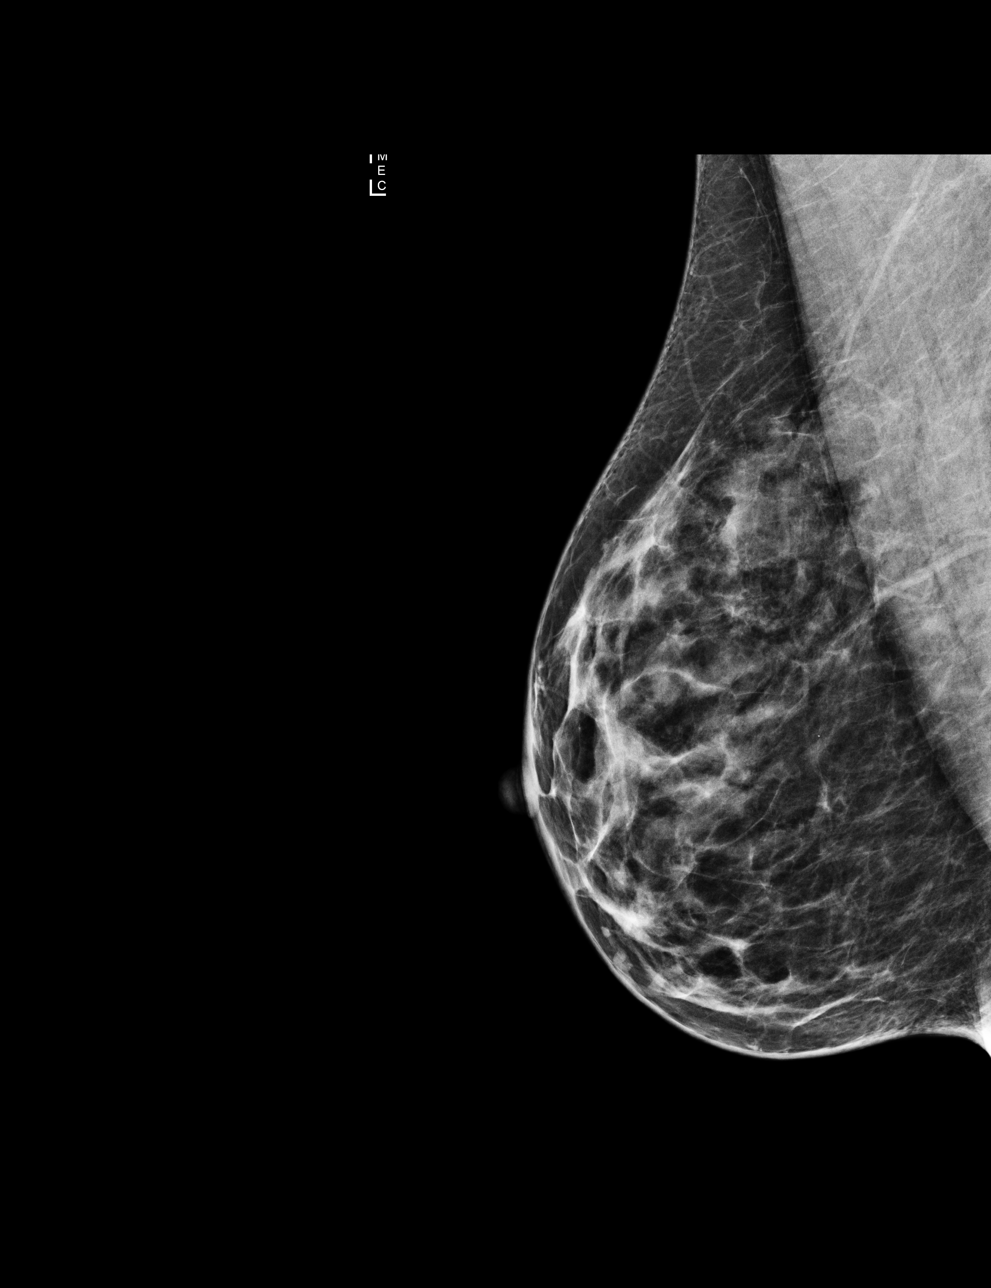

[R CC]
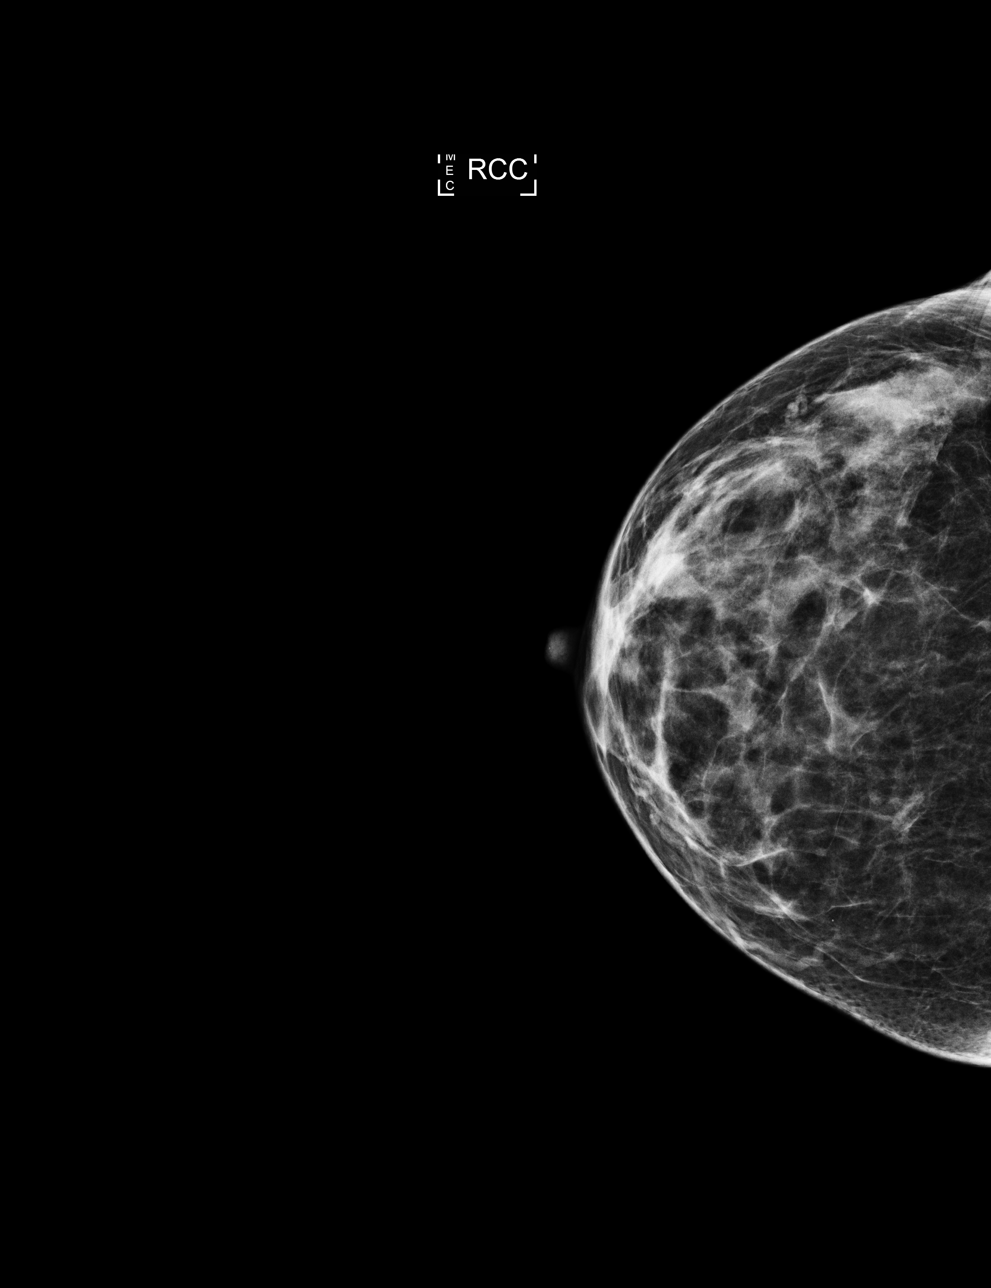

[4 of 4 positions shown; findings below may reference images not displayed]

ACR Breast Density Category c: The breast tissue is heterogeneously
dense, which may obscure small masses
FINDINGS: There are no findings suspicious for malignancy. Images were
processed with CAD.
IMPRESSION: No mammographic evidence of malignancy. A result letter of this
screening mammogram will be mailed directly to the patient.

RECOMMENDATION:
Screening mammogram in one year. (Code:[id number])

BI-RADS CATEGORY  1: Negative.

## 2019-08-17 DIAGNOSIS — C320 Malignant neoplasm of glottis: Secondary | ICD-10-CM | POA: Diagnosis not present

## 2019-08-17 DIAGNOSIS — C329 Malignant neoplasm of larynx, unspecified: Secondary | ICD-10-CM | POA: Diagnosis not present

## 2019-11-22 ENCOUNTER — Telehealth: Payer: Self-pay | Admitting: *Deleted

## 2019-11-22 NOTE — Telephone Encounter (Signed)
A message was left, re: her follow up visit. 

## 2019-12-23 ENCOUNTER — Other Ambulatory Visit: Payer: Self-pay | Admitting: Cardiology

## 2019-12-23 DIAGNOSIS — I471 Supraventricular tachycardia: Secondary | ICD-10-CM

## 2019-12-23 MED ORDER — METOPROLOL SUCCINATE ER 25 MG PO TB24: 25.0000 mg | ORAL_TABLET | Freq: Every day | ORAL | 0 refills | Status: DC

## 2019-12-23 NOTE — Telephone Encounter (Signed)
Called patient to let her know that I refilled her Metoprolol.

## 2019-12-23 NOTE — Telephone Encounter (Signed)
°*  STAT* If patient is at the pharmacy, call can be transferred to refill team.   1. Which medications need to be refilled? (please list name of each medication and dose if known) metoprolol succinate (TOPROL-XL) 25 MG 24 hr tablet  2. Which pharmacy/location (including street and city if local pharmacy) is medication to be sent to? [id number] - [place], [place] - [address]  3. Do they need a 30 day or 90 day supply? [place]

## 2019-12-25 ENCOUNTER — Ambulatory Visit: Payer: Medicare Other | Attending: Internal Medicine

## 2019-12-25 DIAGNOSIS — Z23 Encounter for immunization: Secondary | ICD-10-CM

## 2019-12-25 NOTE — Progress Notes (Signed)
   Covid-19 Vaccination Clinic  Name:  Dawn Gilbert    MRN: [id number] DOB: 1956/09/28  12/25/2019  Dawn Gilbert was observed post Covid-19 immunization for 15 minutes without incidence. She was provided with Vaccine Information Sheet and instruction to access the V-Safe system.   Dawn Gilbert was instructed to call 911 with any severe reactions post vaccine: Marland Kitchen Difficulty breathing  . Swelling of your face and throat  . A fast heartbeat  . A bad rash all over your body  . Dizziness and weakness    Immunizations Administered    Name Date Dose VIS Date Route   Pfizer COVID-19 Vaccine 12/25/2019 10:50 AM 0.3 mL 10/14/2019 Intramuscular   Manufacturer: [place]   Lot: [id number]   [place]: [id number]

## 2019-12-27 ENCOUNTER — Encounter: Payer: Self-pay | Admitting: Cardiology

## 2019-12-27 ENCOUNTER — Telehealth (INDEPENDENT_AMBULATORY_CARE_PROVIDER_SITE_OTHER): Payer: Medicare Other | Admitting: Cardiology

## 2019-12-27 VITALS — Ht 63.5 in | Wt 125.0 lb

## 2019-12-27 DIAGNOSIS — I471 Supraventricular tachycardia: Secondary | ICD-10-CM

## 2019-12-27 DIAGNOSIS — C329 Malignant neoplasm of larynx, unspecified: Secondary | ICD-10-CM | POA: Diagnosis not present

## 2019-12-27 DIAGNOSIS — I340 Nonrheumatic mitral (valve) insufficiency: Secondary | ICD-10-CM | POA: Diagnosis not present

## 2019-12-27 DIAGNOSIS — I059 Rheumatic mitral valve disease, unspecified: Secondary | ICD-10-CM

## 2019-12-27 NOTE — Progress Notes (Signed)
Virtual Visit via Telephone Note   This visit type was conducted due to national recommendations for restrictions regarding the COVID-19 Pandemic (e.g. social distancing) in an effort to limit this patient's exposure and mitigate transmission in our community.  Due to her co-morbid illnesses, this patient is at least at moderate risk for complications without adequate follow up.  This format is felt to be most appropriate for this patient at this time.  The patient did not have access to video technology/had technical difficulties with video requiring transitioning to audio format only (telephone).  All issues noted in this document were discussed and addressed.  No physical exam could be performed with this format.  Please refer to the patient's chart for her  consent to telehealth for [facility].   Date:  12/27/2019   ID:  Dawn Gilbert, DOB 09/12/1956, MRN [id number]  Patient Location: Home Provider Location: Home  PCP:  [name], MD  Cardiologist:  Dr Stanford Breed Electrophysiologist:  None   Evaluation Performed:  Follow-Up Visit  Chief Complaint:  none  History of Present Illness:    Dawn Gilbert is a 64 y.o. female with a history of PSVT and treated laryngeal cancer.  She initially had PSVT in 2015.  It broke with medications.  Since then she has had intermittent episodes although these are brief.  Dr. Stanford Breed has discussed the possibility of ablation but at this time the patient does not feel her symptoms warrant this.  She does have a history of laryngeal cancer which was treated with chemo radiation.  She apparently is doing well from the standpoint, she is followed at [facility].  Her echocardiogram in 2015 did show mild to moderate MR but a follow-up echo in January 2020 showed only mild MR and normal LV function.  The patient was contacted today for annual follow-up.  She has been doing well, occasional palpitations that are short-lived.  She has no other cardiac  complaints.  Overall she feels like she is doing "quite well".  The patient does not have symptoms concerning for COVID-19 infection (fever, chills, cough, or new shortness of breath).    Past Medical History:  Diagnosis Date  . Hoarseness   . Palpitations   . PONV (postoperative nausea and vomiting)    Past Surgical History:  Procedure Laterality Date  . DIRECT LARYNGOSCOPY Bilateral 01/06/2014   Procedure: DIRECT LARYNGOSCOPY with biopsy;  Surgeon: Ruby Cola, MD;  Location: [facility];  Service: ENT;  Laterality: Bilateral;  . ESOPHAGOGASTRODUODENOSCOPY    . KNEE ARTHROSCOPY Bilateral   . MOUTH SURGERY    . TONSILLECTOMY    . tumor removed from left ovary       Current Meds  Medication Sig  . ibuprofen (ADVIL,MOTRIN) 200 MG tablet Take 600 mg by mouth every 6 (six) hours as needed for moderate pain.  . metoprolol succinate (TOPROL-XL) 25 MG 24 hr tablet Take 1 tablet (25 mg total) by mouth daily.  Marland Kitchen PREVIDENT 5000 BOOSTER PLUS 1.1 % PSTE Take 1 application by mouth at bedtime.     Allergies:   Penicillins   Social History   Tobacco Use  . Smoking status: Former Smoker    Packs/day: 0.50    Years: 25.00    Pack years: 12.50    Types: Cigarettes    Quit date: 01/06/2014    Years since quitting: 5.9  . Smokeless tobacco: Never Used  Substance Use Topics  . Alcohol use: Yes    Alcohol/week:  0.0 standard drinks    Comment: Rare use  . Drug use: No     Family Hx: The patient's family history includes Cancer in her father and mother. There is no history of Breast cancer.  ROS:   Please see the history of present illness.    All other systems reviewed and are negative.   Prior CV studies:   The following studies were reviewed today:  Echo jan 2020  Labs/Other Tests and Data Reviewed:    EKG:  No ECG reviewed.  Recent Labs: No results found for requested labs within last 8760 hours.   Recent Lipid Panel Lab Results  Component Value Date/Time   CHOL 225 (H)  03/04/2018 02:49 PM   TRIG 98 03/04/2018 02:49 PM   HDL 77 03/04/2018 02:49 PM   CHOLHDL 2.9 03/04/2018 02:49 PM   LDLCALC 128 (H) 03/04/2018 02:49 PM    Wt Readings from Last 3 Encounters:  12/27/19 125 lb (56.7 kg)  02/08/19 126 lb (57.2 kg)  11/22/18 125 lb 6.4 oz (56.9 kg)     Objective:    Vital Signs:  Ht 5' 3.5" (1.613 m)   Wt 125 lb (56.7 kg)   BMI 21.80 kg/m    VITAL SIGNS:  reviewed  ASSESSMENT & PLAN:    PSVT- Stable  Mild MR- Echo Jan 2020- no symptoms  Laryngeal cancer- Followed at [facility]  COVID-19 Education: The signs and symptoms of COVID-19 were discussed with the patient and how to seek care for testing (follow up with PCP or arrange E-visit).  The importance of social distancing was discussed today.  Time:   Today, I have spent 10 minutes with the patient with telehealth technology discussing the above problems.     Medication Adjustments/Labs and Tests Ordered: Current medicines are reviewed at length with the patient today.  Concerns regarding medicines are outlined above.   Tests Ordered: No orders of the defined types were placed in this encounter.   Medication Changes: No orders of the defined types were placed in this encounter.   Follow Up:  In Person one year  Signed, Susy Manor  12/27/2019 2:11 PM    [place]

## 2020-01-17 ENCOUNTER — Ambulatory Visit: Payer: Medicare Other | Attending: Internal Medicine

## 2020-01-17 DIAGNOSIS — Z23 Encounter for immunization: Secondary | ICD-10-CM

## 2020-01-17 NOTE — Progress Notes (Signed)
   Covid-19 Vaccination Clinic  Name:  Dawn Gilbert    MRN: [id number] DOB: 11-25-1955  01/17/2020  Dawn Gilbert was observed post Covid-19 immunization for 15 minutes without incident. She was provided with Vaccine Information Sheet and instruction to access the V-Safe system.   Dawn Gilbert was instructed to call 911 with any severe reactions post vaccine: Marland Kitchen Difficulty breathing  . Swelling of face and throat  . A fast heartbeat  . A bad rash all over body  . Dizziness and weakness   Immunizations Administered    Name Date Dose VIS Date Route   Pfizer COVID-19 Vaccine 01/17/2020 10:39 AM 0.3 mL 10/14/2019 Intramuscular   Manufacturer: [place]   Lot: [id number]   [place]: [id number]

## 2020-03-12 ENCOUNTER — Other Ambulatory Visit: Payer: Self-pay | Admitting: Cardiology

## 2020-03-12 DIAGNOSIS — I471 Supraventricular tachycardia: Secondary | ICD-10-CM

## 2020-08-16 DIAGNOSIS — C320 Malignant neoplasm of glottis: Secondary | ICD-10-CM | POA: Diagnosis not present

## 2020-08-16 DIAGNOSIS — Z9221 Personal history of antineoplastic chemotherapy: Secondary | ICD-10-CM | POA: Diagnosis not present

## 2020-08-16 DIAGNOSIS — Z923 Personal history of irradiation: Secondary | ICD-10-CM | POA: Diagnosis not present

## 2020-08-16 DIAGNOSIS — C329 Malignant neoplasm of larynx, unspecified: Secondary | ICD-10-CM | POA: Diagnosis not present

## 2020-11-28 ENCOUNTER — Telehealth: Payer: Self-pay | Admitting: *Deleted

## 2020-11-28 NOTE — Telephone Encounter (Signed)
Schedule AWV.  

## 2020-11-30 ENCOUNTER — Other Ambulatory Visit: Payer: Self-pay | Admitting: Cardiology

## 2020-11-30 DIAGNOSIS — I471 Supraventricular tachycardia: Secondary | ICD-10-CM

## 2021-05-26 ENCOUNTER — Other Ambulatory Visit: Payer: Self-pay | Admitting: Cardiology

## 2021-05-26 DIAGNOSIS — I471 Supraventricular tachycardia: Secondary | ICD-10-CM

## 2021-06-25 ENCOUNTER — Other Ambulatory Visit: Payer: Self-pay | Admitting: Family Medicine

## 2021-06-25 DIAGNOSIS — Z1231 Encounter for screening mammogram for malignant neoplasm of breast: Secondary | ICD-10-CM

## 2021-07-03 ENCOUNTER — Other Ambulatory Visit: Payer: Self-pay

## 2021-07-03 ENCOUNTER — Ambulatory Visit
Admission: RE | Admit: 2021-07-03 | Discharge: 2021-07-03 | Disposition: A | Discharge status: Home / Self Care | Payer: Medicare Other | Source: Ambulatory Visit

## 2021-07-03 DIAGNOSIS — Z1231 Encounter for screening mammogram for malignant neoplasm of breast: Secondary | ICD-10-CM

## 2021-11-18 ENCOUNTER — Other Ambulatory Visit: Payer: Self-pay | Admitting: Cardiology

## 2021-11-18 DIAGNOSIS — I471 Supraventricular tachycardia: Secondary | ICD-10-CM

## 2021-12-13 ENCOUNTER — Other Ambulatory Visit: Payer: Self-pay | Admitting: Cardiology

## 2021-12-13 DIAGNOSIS — I471 Supraventricular tachycardia: Secondary | ICD-10-CM

## 2022-01-08 ENCOUNTER — Other Ambulatory Visit: Payer: Self-pay | Admitting: Cardiology

## 2022-01-08 DIAGNOSIS — I471 Supraventricular tachycardia: Secondary | ICD-10-CM

## 2022-01-17 NOTE — Progress Notes (Signed)
? ? ? ? ?HPI: Follow-up SVT. Patient was noted to have wide complex tachycardia in March 2015. This was felt to be SVT with aberrancy. Carotid massage converted the patient to atrial fibrillation. She was given Cardizem and then converted to sinus rhythm. Beta blockade added. Echocardiogram January 2020 showed normal LV function, grade 1 diastolic dysfunction, mild mitral regurgitation, mild tricuspid regurgitation.  Since last seen, she denies dyspnea, chest pain or syncope.  She continues to have short bouts of SVT typically approximately 1 time monthly.  These last approximately 1 minute and she can terminate them with Valsalva. ? ?Current Outpatient Medications  ?Medication Sig Dispense Refill  ? ibuprofen (ADVIL,MOTRIN) 200 MG tablet Take 600 mg by mouth every 6 (six) hours as needed for moderate pain.    ? metoprolol succinate (TOPROL-XL) 25 MG 24 hr tablet TAKE 1 TABLET (25 MG TOTAL) BY MOUTH DAILY. KEEP UPCOMING APPOINTMENT FOR CONTINUATION OF REFILLS. 30 tablet 0  ? PREVIDENT 5000 BOOSTER PLUS 1.1 % PSTE Take 1 application by mouth at bedtime.  11  ? ?No current facility-administered medications for this visit.  ? ? ? ?Past Medical History:  ?Diagnosis Date  ? Hoarseness   ? Palpitations   ? PONV (postoperative nausea and vomiting)   ? ? ?Past Surgical History:  ?Procedure Laterality Date  ? DIRECT LARYNGOSCOPY Bilateral 01/06/2014  ? Procedure: DIRECT LARYNGOSCOPY with biopsy;  Surgeon: Ruby Cola, MD;  Location: [facility] OR;  Service: ENT;  Laterality: Bilateral;  ? ESOPHAGOGASTRODUODENOSCOPY    ? KNEE ARTHROSCOPY Bilateral   ? MOUTH SURGERY    ? TONSILLECTOMY    ? tumor removed from left ovary    ? ? ?Social History  ? ?Socioeconomic History  ? Marital status: Married  ?  Spouse name: Not on file  ? Number of children: 2  ? Years of education: Not on file  ? Highest education level: Not on file  ?Occupational History  ? Occupation: Chief Executive Officer  ?  Employer: [employer]  ?Tobacco Use  ? Smoking  status: Former  ?  Packs/day: 0.50  ?  Years: 25.00  ?  Pack years: 12.50  ?  Types: Cigarettes  ?  Quit date: 01/06/2014  ?  Years since quitting: 8.0  ? Smokeless tobacco: Never  ?Vaping Use  ? Vaping Use: Never used  ?Substance and Sexual Activity  ? Alcohol use: Yes  ?  Alcohol/week: 0.0 standard drinks  ?  Comment: Rare use  ? Drug use: No  ? Sexual activity: Yes  ?  Birth control/protection: Post-menopausal  ?Other Topics Concern  ? Not on file  ?Social History Narrative  ? Lives with husband in [place]. 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  ? ?Social Determinants of Health  ? ?Financial Resource Strain: Not on file  ?Food Insecurity: Not on file  ?Transportation Needs: Not on file  ?Physical Activity: Not on file  ?Stress: Not on file  ?Social Connections: Not on file  ?Intimate Partner Violence: Not on file  ? ? ?Family History  ?Problem Relation Age of Onset  ? Cancer Mother   ? Cancer Father   ? Breast cancer Neg Hx   ? ? ?ROS: no fevers or chills, productive cough, hemoptysis, dysphasia, odynophagia, melena, hematochezia, dysuria, hematuria, rash, seizure activity, orthopnea, PND, pedal edema, claudication. Remaining systems are negative. ? ?Physical Exam: ?Well-developed well-nourished in no acute distress.  ?Skin is warm and dry.  ?HEENT is normal.  ?Neck is supple.  ?Chest is clear  to auscultation with normal expansion.  ?Cardiovascular exam is regular rate and rhythm.  ?Abdominal exam nontender or distended. No masses palpated.  Positive bruit ?Extremities show no edema. ?neuro grossly intact ? ?ECG-normal sinus rhythm at a rate of 82, no ST changes.  Personally reviewed ? ?A/P ? ?1 SVT-patient continues to have occasional episodes of SVT that can be terminated with Valsalva.  Continue beta-blocker.  If she has more frequent prolonged episodes in the future we will consider referral to electrophysiology for consideration of ablation. ? ?2 mitral regurgitation-mild on most recent echocardiogram. ? ?3  bruit-patient also with history of tobacco use.  We will arrange abdominal ultrasound to exclude aneurysm. ? ?Kirk Ruths, MD ? ? ? ?

## 2022-01-30 ENCOUNTER — Ambulatory Visit: Payer: Medicare Other | Admitting: Cardiology

## 2022-01-30 ENCOUNTER — Encounter: Payer: Self-pay | Admitting: Cardiology

## 2022-01-30 VITALS — BP 119/76 | HR 82 | Ht 63.0 in | Wt 126.6 lb

## 2022-01-30 DIAGNOSIS — I471 Supraventricular tachycardia: Secondary | ICD-10-CM

## 2022-01-30 DIAGNOSIS — R0989 Other specified symptoms and signs involving the circulatory and respiratory systems: Secondary | ICD-10-CM

## 2022-01-30 NOTE — Patient Instructions (Signed)
?  Testing/Procedures:  Your physician has requested that you have an abdominal aorta duplex. During this test, an ultrasound is used to evaluate the aorta. Allow 30 minutes for this exam. Do not eat after midnight the day before and avoid carbonated beverages [facility]   Follow-Up: At [facility], you and your health needs are our priority.  As part of our continuing mission to provide you with exceptional heart care, we have created designated Provider Care Teams.  These Care Teams include your primary Cardiologist (physician) and Advanced Practice Providers (APPs -  Physician Assistants and Nurse Practitioners) who all work together to provide you with the care you need, when you need it.  We recommend signing up for the patient portal called "MyChart".  Sign up information is provided on this After Visit Summary.  MyChart is used to connect with patients for Virtual Visits (Telemedicine).  Patients are able to view lab/test results, encounter notes, upcoming appointments, etc.  Non-urgent messages can be sent to your provider as well.   To learn more about what you can do with MyChart, go to [email or web address].    Your next appointment:   12 month(s)  The format for your next appointment:   In Person  Provider:   BRIAN CRENSHAW MD           

## 2022-02-01 ENCOUNTER — Other Ambulatory Visit: Payer: Self-pay | Admitting: Cardiology

## 2022-02-01 DIAGNOSIS — I471 Supraventricular tachycardia: Secondary | ICD-10-CM

## 2022-02-13 ENCOUNTER — Ambulatory Visit (HOSPITAL_COMMUNITY)
Admission: RE | Admit: 2022-02-13 | Discharge: 2022-02-13 | Disposition: A | Discharge status: Home / Self Care | Payer: Medicare Other | Source: Ambulatory Visit | Attending: Internal Medicine | Admitting: Internal Medicine

## 2022-02-13 DIAGNOSIS — Z87891 Personal history of nicotine dependence: Secondary | ICD-10-CM

## 2022-02-13 DIAGNOSIS — Z136 Encounter for screening for cardiovascular disorders: Secondary | ICD-10-CM | POA: Insufficient documentation

## 2022-02-13 DIAGNOSIS — R0989 Other specified symptoms and signs involving the circulatory and respiratory systems: Secondary | ICD-10-CM | POA: Insufficient documentation

## 2022-09-26 ENCOUNTER — Emergency Department (HOSPITAL_BASED_OUTPATIENT_CLINIC_OR_DEPARTMENT_OTHER)
Admission: EM | Admit: 2022-09-26 | Discharge: 2022-09-26 | Disposition: A | Discharge status: Home / Self Care | Payer: Medicare Other | Attending: Emergency Medicine | Admitting: Emergency Medicine

## 2022-09-26 ENCOUNTER — Encounter (HOSPITAL_BASED_OUTPATIENT_CLINIC_OR_DEPARTMENT_OTHER): Payer: Self-pay | Admitting: Emergency Medicine

## 2022-09-26 ENCOUNTER — Other Ambulatory Visit: Payer: Self-pay

## 2022-09-26 DIAGNOSIS — R300 Dysuria: Secondary | ICD-10-CM | POA: Diagnosis present

## 2022-09-26 DIAGNOSIS — N390 Urinary tract infection, site not specified: Secondary | ICD-10-CM | POA: Diagnosis not present

## 2022-09-26 DIAGNOSIS — D72829 Elevated white blood cell count, unspecified: Secondary | ICD-10-CM | POA: Insufficient documentation

## 2022-09-26 LAB — CBC WITH DIFFERENTIAL/PLATELET
Abs Immature Granulocytes: 0.05 10*3/uL (ref 0.00–0.07)
Basophils Absolute: 0.1 10*3/uL (ref 0.0–0.1)
Basophils Relative: 0 %
Eosinophils Absolute: 0 10*3/uL (ref 0.0–0.5)
Eosinophils Relative: 0 %
HCT: 37.5 % (ref 36.0–46.0)
Hemoglobin: 12.9 g/dL (ref 12.0–15.0)
Immature Granulocytes: 0 %
Lymphocytes Relative: 8 %
Lymphs Abs: 0.9 10*3/uL (ref 0.7–4.0)
MCH: 31.9 pg (ref 26.0–34.0)
MCHC: 34.4 g/dL (ref 30.0–36.0)
MCV: 92.6 fL (ref 80.0–100.0)
Monocytes Absolute: 1.3 10*3/uL — ABNORMAL HIGH (ref 0.1–1.0)
Monocytes Relative: 10 %
Neutro Abs: 9.8 10*3/uL — ABNORMAL HIGH (ref 1.7–7.7)
Neutrophils Relative %: 82 %
Platelets: 222 10*3/uL (ref 150–400)
RBC: 4.05 MIL/uL (ref 3.87–5.11)
RDW: 12.2 % (ref 11.5–15.5)
WBC: 12 10*3/uL — ABNORMAL HIGH (ref 4.0–10.5)
nRBC: 0 % (ref 0.0–0.2)

## 2022-09-26 LAB — URINALYSIS, MICROSCOPIC (REFLEX): WBC, UA: >50 WBC/hpf (ref 0–5)

## 2022-09-26 LAB — COMPREHENSIVE METABOLIC PANEL
ALT: 13 U/L (ref 0–44)
AST: 15 U/L (ref 15–41)
Albumin: 4.2 g/dL (ref 3.5–5.0)
Alkaline Phosphatase: 51 U/L (ref 38–126)
Anion gap: 11 (ref 5–15)
BUN: 18 mg/dL (ref 8–23)
CO2: 22 mmol/L (ref 22–32)
Calcium: 9.1 mg/dL (ref 8.9–10.3)
Chloride: 103 mmol/L (ref 98–111)
Creatinine, Ser: 1.07 mg/dL — ABNORMAL HIGH (ref 0.44–1.00)
GFR, Estimated: 57 mL/min — ABNORMAL LOW (ref >60)
Glucose, Bld: 123 mg/dL — ABNORMAL HIGH (ref 70–99)
Potassium: 3.8 mmol/L (ref 3.5–5.1)
Sodium: 136 mmol/L (ref 135–145)
Total Bilirubin: 1 mg/dL (ref 0.3–1.2)
Total Protein: 7.3 g/dL (ref 6.5–8.1)

## 2022-09-26 LAB — URINALYSIS, ROUTINE W REFLEX MICROSCOPIC
Bilirubin Urine: NEGATIVE
Glucose, UA: NEGATIVE mg/dL
Ketones, ur: 40 mg/dL — AB
Nitrite: POSITIVE — AB
Protein, ur: 30 mg/dL — AB
Specific Gravity, Urine: 1.01 (ref 1.005–1.030)
pH: 5.5 (ref 5.0–8.0)

## 2022-09-26 LAB — LACTIC ACID, PLASMA: Lactic Acid, Venous: 1 mmol/L (ref 0.5–1.9)

## 2022-09-26 MED ORDER — CEPHALEXIN 250 MG PO CAPS
500.0000 mg | ORAL_CAPSULE | Freq: Once | ORAL | Status: AC
  Administered 2022-09-26: 500 mg via ORAL
  Filled 2022-09-26: qty 2

## 2022-09-26 MED ORDER — ACETAMINOPHEN 500 MG PO TABS
1000.0000 mg | ORAL_TABLET | Freq: Once | ORAL | Status: AC
  Administered 2022-09-26: 1000 mg via ORAL
  Filled 2022-09-26: qty 2

## 2022-09-26 MED ORDER — CEPHALEXIN 500 MG PO CAPS: 500.0000 mg | ORAL_CAPSULE | Freq: Two times a day (BID) | ORAL | 0 refills | Status: DC

## 2022-09-26 NOTE — ED Triage Notes (Signed)
Urinary urgency, burning with urination and fever since yesterday. Sent here from [place].

## 2022-09-26 NOTE — Discharge Instructions (Addendum)
You were seen in the emergency department for your pain with urination.  Your urine was positive for urinary tract infection.  You no signs of kidney infection or complications from your infection.  This is treated with antibiotics and you should complete this as prescribed.  You can continue to take Tylenol or Motrin as needed for fevers or pain.  You should follow-up with your primary doctor to have your symptoms rechecked.  You should return to the emergency department if you develop back pain, you have continued fevers despite the antibiotics, you have vomiting and cannot tolerate the antibiotics or if you have any other new or concerning symptoms.

## 2022-09-26 NOTE — ED Provider Notes (Signed)
[place] EMERGENCY DEPARTMENT Provider Note   CSN: [id number] Arrival date & time: 09/26/22  1813     History  Chief Complaint  Patient presents with   Dysuria    Dawn Gilbert is a 66 y.o. female.  Patient is a 66 year old female with a past medical history of paroxysmal SVT presenting to the emergency department with suprapubic pain.  The patient states that she has had suprapubic pain for the past few days and has had some increase frequency and urgency with urination.  She states that yesterday she developed a fever which prompted her to come to the emergency department.  She denies any nausea or vomiting or flank pain.  She denies any diarrhea or constipation.  She denies any history of frequent UTIs.  The history is provided by the patient.  Dysuria      Home Medications Prior to Admission medications   Medication Sig Start Date End Date Taking? Authorizing Provider  cephALEXin (KEFLEX) 500 MG capsule Take 1 capsule (500 mg total) by mouth 2 (two) times daily. 09/26/22  Yes Maylon Peppers, [place] K, DO  ibuprofen (ADVIL,MOTRIN) 200 MG tablet Take 600 mg by mouth every 6 (six) hours as needed for moderate pain.    [provider]  metoprolol succinate (TOPROL-XL) 25 MG 24 hr tablet Take 1 tablet (25 mg total) by mouth daily. 02/03/22   Lelon Perla, MD  PREVIDENT 5000 BOOSTER PLUS 1.1 % PSTE Take 1 application by mouth at bedtime. 05/09/15   [provider]      Allergies    Penicillins    Review of Systems   Review of Systems  Genitourinary:  Positive for dysuria.    Physical Exam Updated Vital Signs BP 120/75 (BP Location: Left Arm)   Pulse 99   Temp 98.2 F (36.8 C) (Oral)   Resp 17   Ht '5\' 3"'$  (1.6 m)   Wt 56.7 kg   SpO2 97%   BMI 22.14 kg/m  Physical Exam Vitals and nursing note reviewed.  Constitutional:      General: She is not in acute distress.    Appearance: Normal appearance.  HENT:     Head: Normocephalic and  atraumatic.     Nose: Nose normal.     Mouth/Throat:     Mouth: Mucous membranes are moist.     Pharynx: Oropharynx is clear.  Eyes:     Extraocular Movements: Extraocular movements intact.     Conjunctiva/sclera: Conjunctivae normal.  Cardiovascular:     Rate and Rhythm: Normal rate and regular rhythm.     Pulses: Normal pulses.     Heart sounds: Normal heart sounds.  Pulmonary:     Effort: Pulmonary effort is normal.     Breath sounds: Normal breath sounds.  Abdominal:     General: Abdomen is flat.     Palpations: Abdomen is soft.     Tenderness: There is abdominal tenderness (Mild suprapubic). There is no right CVA tenderness, left CVA tenderness, guarding or rebound.  Musculoskeletal:        General: Normal range of motion.     Cervical back: Normal range of motion and neck supple.  Skin:    General: Skin is warm and dry.  Neurological:     General: No focal deficit present.     Mental Status: She is alert and oriented to person, place, and time.  Psychiatric:        Mood and Affect: Mood normal.  Behavior: Behavior normal.     ED Results / Procedures / Treatments   Labs (all labs ordered are listed, but only abnormal results are displayed) Labs Reviewed  URINALYSIS, ROUTINE W REFLEX MICROSCOPIC - Abnormal; Notable for the following components:      Result Value   Hgb urine dipstick LARGE (*)    Ketones, ur 40 (*)    Protein, ur 30 (*)    Nitrite POSITIVE (*)    Leukocytes,Ua MODERATE (*)    All other components within normal limits  URINALYSIS, MICROSCOPIC (REFLEX) - Abnormal; Notable for the following components:   Bacteria, UA MANY (*)    All other components within normal limits  CBC WITH DIFFERENTIAL/PLATELET - Abnormal; Notable for the following components:   WBC 12.0 (*)    Neutro Abs 9.8 (*)    Monocytes Absolute 1.3 (*)    All other components within normal limits  COMPREHENSIVE METABOLIC PANEL - Abnormal; Notable for the following components:    Glucose, Bld 123 (*)    Creatinine, Ser 1.07 (*)    GFR, Estimated 57 (*)    All other components within normal limits  URINE CULTURE  LACTIC ACID, PLASMA    EKG None  Radiology No results found.  Procedures Procedures    Medications Ordered in ED Medications  cephALEXin (KEFLEX) capsule 500 mg (has no administration in time range)  acetaminophen (TYLENOL) tablet 1,000 mg (1,000 mg Oral Given 09/26/22 1833)    ED Course/ Medical Decision Making/ A&P                           Medical Decision Making This patient presents to the ED with chief complaint(s) of suprapubic pain with pertinent past medical history of paroxysmal SVT which further complicates the presenting complaint. The complaint involves an extensive differential diagnosis and also carries with it a high risk of complications and morbidity.    The differential diagnosis includes UTI, pyelonephritis unlikely as she has no CVA tenderness, sepsis, other intra-abdominal infection  Additional history obtained: Additional history obtained from N/A Records reviewed urgent care records  ED Course and Reassessment: Patient was initially tachycardic on arrival and otherwise hemodynamically stable.  Her heart rate improved without any intervention.  Work-up performed showed mild leukocytosis but no evidence of endorgan damage and no elevated lactate making sepsis unlikely.  The patient's UA is positive for UTI.  She has no CVA tenderness making pyelonephritis unlikely.  She will be treated with antibiotics and with primary care follow-up.  Independent labs interpretation:  The following labs were independently interpreted: Positive for UTI otherwise within normal range  Independent visualization of imaging: N/A  Consultation: - Consulted or discussed management/test interpretation w/ external professional: N/A  Consideration for admission or further workup: Patient has no emergent conditions requiring admission or  further work-up at this time and is stable for discharge home with primary care follow-up Social Determinants of health: N/A    Amount and/or Complexity of Data Reviewed Labs: ordered.  Risk OTC drugs. Prescription drug management.          Final Clinical Impression(s) / ED Diagnoses Final diagnoses:  Lower urinary tract infectious disease    Rx / DC Orders ED Discharge Orders          Ordered    cephALEXin (KEFLEX) 500 MG capsule  2 times daily        09/26/22 2129  Kemper Durie, DO 09/26/22 2130

## 2022-09-29 LAB — URINE CULTURE: Culture: >=100000 — AB

## 2022-10-01 ENCOUNTER — Telehealth (HOSPITAL_BASED_OUTPATIENT_CLINIC_OR_DEPARTMENT_OTHER): Payer: Self-pay | Admitting: *Deleted

## 2022-10-01 NOTE — Telephone Encounter (Signed)
Post ED Visit - Positive Culture Follow-up  Culture report reviewed by antimicrobial stewardship pharmacist: [place] Team '[]'$  Elenor Quinones, Pharm.D. '[]'$  Heide Guile, Pharm.D., BCPS AQ-ID '[]'$  Parks Neptune, Pharm.D., BCPS '[]'$  Alycia Rossetti, Pharm.D., BCPS '[]'$  [place], Pharm.D., BCPS, AAHIVP '[]'$  Legrand Como, Pharm.D., BCPS, AAHIVP '[]'$  Salome Arnt, PharmD, BCPS '[]'$  Johnnette Gourd, PharmD, BCPS '[]'$  Hughes Better, PharmD, BCPS '[]'$  Leeroy Cha, PharmD '[]'$  Laqueta Linden, PharmD, BCPS '[]'$  Albertina Parr, PharmD  [place] Team '[]'$  Leodis Sias, PharmD '[]'$  Lindell Spar, PharmD '[]'$  Royetta Asal, PharmD '[]'$  Graylin Shiver, Rph '[]'$  Rema Fendt) Glennon Mac, PharmD '[]'$  Arlyn Dunning, PharmD '[]'$  Netta Cedars, PharmD '[]'$  Dia Sitter, PharmD '[]'$  Leone Haven, PharmD '[]'$  Gretta Arab, PharmD '[]'$  Theodis Shove, PharmD '[]'$  Peggyann Juba, PharmD '[]'$  Reuel Boom, PharmD   Positive urine culture Treated with Cephalexin, organism sensitive to the same and no further patient follow-up is required at this time. Gena Fray, Pharm D Ardeen Fillers 10/01/2022, 11:16 AM

## 2023-01-25 ENCOUNTER — Other Ambulatory Visit: Payer: Self-pay | Admitting: Cardiology

## 2023-01-25 DIAGNOSIS — I471 Supraventricular tachycardia, unspecified: Secondary | ICD-10-CM

## 2023-12-07 NOTE — Progress Notes (Signed)
 Cardiology Office Note:  .   Date:  12/11/2023  ID:  Dawn Gilbert, DOB 12/04/1955, MRN [id number] PCP: Levora Reyes DEL, MD  [facility] Providers Cardiologist:  Redell Shallow, MD  History of Present Illness: Dawn Gilbert is a 68 y.o. female with a past medical history of SVT, abdominal bruit on exam. She is followed by Dr. Shallow and presents today for a follow up appointment   Patient previously noted to have a wide-complex tachycardia in 01/2014. This was felt to be SVT with aberrancy. She underwent carotid massage and converted to atrial fibrillation. She was given cardizem , and converted to NSR. She was started on bB at that time. She later underwent echocardiogram on 12/03/18 that showed normal left and right atrial size, mild MR, mild TR. EF was 60-65%.    Patient was last seen by Dr. Shallow on 01/30/22. At that time, patient continued to have about 1 episode of SVT monthly. Episodes only last about 1 minute, and she could terminate episodes with valsalva. She remained on her BB. She had an abdominal bruit on exam, underwent abdominal ultrasound on 02/13/22 that showed no evidence of AAA.   Today, patient presents for a one year follow up appointment. She has a history of SVT, which has been well controlled. However, she does report new onset palpitations that started last week and are different than what she felt when she had SVT. She describes the sensation as her heart skipping a beat. Are not consistent, but skipped beats all seem to happen within a 30 minutes of each other. These episodes have occurred on three separate days within the past week. She notes that these palpitations are not continuous and do not occur when she is lying down or relaxing. She denies any associated symptoms such as dizziness or feeling like she is going to pass out. She has noticed that these episodes tend to start after she consumes caffeine, and she has been trying to cut back on her caffeine  intake. She denies any chest pain and continues to maintain an active lifestyle, walking two to three miles a day and working out with weights three to four times a week. She has not noticed any increase in her tachycardia symptoms.  ROS: Denies chest pain, shortness of breath, ankle swelling, syncope, near syncope. Does have occasional palpitations   Studies Reviewed: .     Cardiac Studies & Procedures      ECHOCARDIOGRAM  ECHOCARDIOGRAM COMPLETE 12/03/2018  Narrative TRANSTHORACIC ECHOCARDIOGRAM REPORT    Patient Name:   Dawn Gilbert  Date of Exam: 12/03/2018 Medical Rec #:  [id number]     Height:       63.0 in Accession #:    [id number]    Weight:       125.4 lb Date of Birth:  1956-04-22     BSA:          1.59 m Patient Age:    63 years      BP:           124/62 mmHg Patient Gender: F             HR:           69 bpm. Exam Location:  [address]   Procedure: 2D Echo, 3D Echo, Cardiac Doppler and Color Doppler  Indications:    I05.9 Mitral Valve Regurgitation  History:        Patient has prior history of Echocardiogram  examinations. Atrial Fibrillation; Risk Factors: Former Smoker. Supraventricular Tachycardia, Palpitations.  Sonographer:    Heather Hawks RDCS Referring Phys: [id number] BRIAN S CRENSHAW  IMPRESSIONS   1. Normal left atrial size. 2. Normal right atrial size. 3. The mitral valve normal in structure. Regurgitation is mild by color flow Doppler . No evidence of mitral valve stenosis. 4. Normal tricuspid valve structure. 5. Tricuspid regurgitation is mild. 6. The aortic root and ascending aortaare normal is size and structure. 7. The interatrial septum was not assessed.  FINDINGS Left Ventricle: The left ventricle has normal systolic function of 60-65%. The cavity size is normal. There is normal left ventricular wall thickness. Echo evidence of impaired relaxation diastolic filling patterns. Right Ventricle: The right ventricle is normal in size. There is  normal hypertrophy. There is normal systolic function. Left Atrium: The left atrium is normal in size. Right Atrium: The right atrial size is normal in size. Interatrial Septum: The interatrial septum was not assessed.  Pericardium: There is no evidence of pericardial effusion. Mitral Valve: The mitral valve normal in structure. Regurgitation is mild by color flow Doppler . No evidence of mitral valve stenosis. Tricuspid Valve: The tricuspid valve is normal in structure. Tricuspid regurgitation is mild by color flow Doppler. Aortic Valve: The aortic valve normal in structure and function. Pulmonic Valve: The pulmonic valve was not well visualized. The pulmonic valve is grossly normal. Pulmonic valve regurgitation is trivial by color flow Doppler. Aorta: The aortic root and ascending aortaare normal is size and structure. Venous: The inferior vena cava was normal in size with greater than 50% respiratory variablity.  LEFT VENTRICLE PLAX 2D (Teich) LV EF:          65.2 %   Diastology LVIDd:          3.50 cm  LV e' lateral:   12.30 cm/s LVIDs:          2.28 cm  LV E/e' lateral: 7.3 LV PW:          0.68 cm  LV e' medial:    10.80 cm/s LV IVS:         0.62 cm  LV E/e' medial:  8.4 LVOT diam:      1.70 cm LV SV:          33 ml LVOT Area:      2.27 cm  RIGHT VENTRICLE RV S prime:     14.30 cm/s TAPSE (M-mode): 2.3 cm  LEFT ATRIUM             Index       RIGHT ATRIUM           Index LA diam:        2.80 cm 1.77 cm/m  RA Pressure: 3 mmHg LA Vol (A2C):   40.9 ml 25.79 ml/m RA Area:     12.20 cm LA Vol (A4C):   34.1 ml 21.51 ml/m RA Volume:   30.70 ml  19.36 ml/m LA Biplane Vol: 40.0 ml 25.23 ml/m AORTIC VALVE LVOT Vmax:   114.00 cm/s LVOT Vmean:  67.300 cm/s LVOT VTI:    0.293 m  AORTA Ao Root diam: 2.50 cm Ao Asc diam:  2.30 cm  MITRAL VALVE MV Area (PHT): MV PHT: MV Decel Time: 328 msec MV E velocity: 90.25 cm/s MV A velocity: 103.60 cm/s MV E/A ratio:  0.87   Aleene Passe MD Electronically signed by Aleene Passe MD Signature Date/Time: 12/03/2018/1:47:34 PM    Final  Risk Assessment/Calculations:             Physical Exam:   VS:  BP 138/76 (BP Location: Right Arm, Patient Position: Sitting, Cuff Size: Normal)   Pulse 83   Ht 5' 3 (1.6 m)   Wt 125 lb (56.7 kg)   SpO2 98%   BMI 22.14 kg/m    Wt Readings from Last 3 Encounters:  12/11/23 125 lb (56.7 kg)  09/26/22 125 lb (56.7 kg)  01/30/22 126 lb 9.6 oz (57.4 kg)    GEN: Well nourished, well developed in no acute distress. Sitting upright on the exam table  NECK: No JVD; No carotid bruits CARDIAC:  RRR, no murmurs, rubs, gallops. Radial pulses 2+ bilaterally  RESPIRATORY:  Clear to auscultation without rales, wheezing or rhonchi. Normal WOB on room air   ABDOMEN: Soft, non-tender, non-distended EXTREMITIES:  No edema; No deformity   ASSESSMENT AND PLAN: .    SVT  Palpitations  - Patient reports that her symptoms of SVT have been very well controlled recently. However, she now has been having episodes of palpitations that are different than her SVT symptoms. She reports feeling like her heart skips a beat. Have occurred about 3 days in the past week. Are not constant, but seem to be grouped within 30 minutes of each other  - Notes that palpitations are worsened by caffeine intake. She only drinks about 30 ounces of fluid per day  - Suspect patient is having PVCs/PACs. Offered cardiac monitor, but patient would prefer to wait - Ordered BMP, mag, TSH, free T4 - EKG today showed NSR  - Discussed avoiding triggers such as caffeine. Encouraged her to drink more water  to stay hydrated  - Increase metoprolol  succinate to 50 mg daily   Mitral Regurgitation  - Mild on echocardiogram from 2020  - No new symptoms to suggest progression. No murmur on exam    Dispo: Follow up in 1 year with Dr. Pietro   Signed, Rollo FABIENE Louder, PA-C

## 2023-12-11 ENCOUNTER — Ambulatory Visit: Payer: Medicare Other | Attending: Cardiology | Admitting: Cardiology

## 2023-12-11 ENCOUNTER — Encounter: Payer: Self-pay | Admitting: Cardiology

## 2023-12-11 VITALS — BP 138/76 | HR 83 | Ht 63.0 in | Wt 125.0 lb

## 2023-12-11 DIAGNOSIS — I059 Rheumatic mitral valve disease, unspecified: Secondary | ICD-10-CM

## 2023-12-11 DIAGNOSIS — I471 Supraventricular tachycardia, unspecified: Secondary | ICD-10-CM | POA: Diagnosis not present

## 2023-12-11 DIAGNOSIS — R002 Palpitations: Secondary | ICD-10-CM | POA: Diagnosis not present

## 2023-12-11 MED ORDER — METOPROLOL SUCCINATE ER 50 MG PO TB24: 50.0000 mg | ORAL_TABLET | Freq: Every day | ORAL | 3 refills | Status: DC

## 2023-12-11 NOTE — Patient Instructions (Addendum)
 Medication Instructions:  Increase Metoprolol  Succinate to 50 mg once a day *If you need a refill on your cardiac medications before your next appointment, please call your pharmacy*  Lab Work: Today we are going to draw TSH, Free T4, Bmet, and Mag If you have labs (blood work) drawn today and your tests are completely normal, you will receive your results only by: MyChart Message (if you have MyChart) OR A paper copy in the mail If you have any lab test that is abnormal or we need to change your treatment, we will call you to review the results.  Testing/Procedures: No testing   Follow-Up: At [facility], you and your health needs are our priority.  As part of our continuing mission to provide you with exceptional heart care, we have created designated Provider Care Teams.  These Care Teams include your primary Cardiologist (physician) and Advanced Practice Providers (APPs -  Physician Assistants and Nurse Practitioners) who all work together to provide you with the care you need, when you need it.  We recommend signing up for the patient portal called MyChart.  Sign up information is provided on this After Visit Summary.  MyChart is used to connect with patients for Virtual Visits (Telemedicine).  Patients are able to view lab/test results, encounter notes, upcoming appointments, etc.  Non-urgent messages can be sent to your provider as well.   To learn more about what you can do with MyChart, go to [email or web address].    Your next appointment:   1 year(s)  Provider:   Redell Shallow, MD  or Rollo Louder, PA-C

## 2023-12-12 LAB — BASIC METABOLIC PANEL
BUN/Creatinine Ratio: 19 (ref 12–28)
BUN: 19 mg/dL (ref 8–27)
CO2: 23 mmol/L (ref 20–29)
Calcium: 9.7 mg/dL (ref 8.7–10.3)
Chloride: 105 mmol/L (ref 96–106)
Creatinine, Ser: 1 mg/dL (ref 0.57–1.00)
Glucose: 100 mg/dL — ABNORMAL HIGH (ref 70–99)
Potassium: 4.8 mmol/L (ref 3.5–5.2)
Sodium: 143 mmol/L (ref 134–144)
eGFR: 61 mL/min/{1.73_m2} (ref >59)

## 2023-12-12 LAB — MAGNESIUM: Magnesium: 2.1 mg/dL (ref 1.6–2.3)

## 2023-12-12 LAB — TSH: TSH: 6.96 u[IU]/mL — ABNORMAL HIGH (ref 0.450–4.500)

## 2023-12-12 LAB — T4, FREE: Free T4: 1.16 ng/dL (ref 0.82–1.77)

## 2023-12-14 ENCOUNTER — Telehealth: Payer: Self-pay

## 2023-12-14 NOTE — Telephone Encounter (Signed)
-----   Message from Debria Fang sent at 12/14/2023  6:50 AM EST ----- Please tell patient that her TSH is a bit elevated, but her Free T4 (active thyroid  hormone in the blood stream) is within normal limits. No need for medications/further treatment for now, but it would be worth discussing with her PCP so that they could continue to monitor her thyroid  function moving forward.   Otherwise, lab work showed normal kidney function and normal electrolytes. No changes to current treatment plan   Thanks,  KJ

## 2023-12-14 NOTE — Telephone Encounter (Signed)
 Called patient advised of below they verbalized understanding.

## 2024-10-13 ENCOUNTER — Encounter (HOSPITAL_BASED_OUTPATIENT_CLINIC_OR_DEPARTMENT_OTHER): Payer: Self-pay

## 2024-10-13 ENCOUNTER — Other Ambulatory Visit: Payer: Self-pay

## 2024-10-13 ENCOUNTER — Emergency Department (HOSPITAL_BASED_OUTPATIENT_CLINIC_OR_DEPARTMENT_OTHER)
Admission: EM | Admit: 2024-10-13 | Discharge: 2024-10-13 | Disposition: A | Discharge status: Home / Self Care | Attending: Emergency Medicine | Admitting: Emergency Medicine

## 2024-10-13 DIAGNOSIS — Z2914 Encounter for prophylactic rabies immune globin: Secondary | ICD-10-CM | POA: Diagnosis not present

## 2024-10-13 DIAGNOSIS — Z23 Encounter for immunization: Secondary | ICD-10-CM | POA: Diagnosis not present

## 2024-10-13 DIAGNOSIS — Z203 Contact with and (suspected) exposure to rabies: Secondary | ICD-10-CM | POA: Diagnosis present

## 2024-10-13 MED ORDER — RABIES VIRUS VACCINE, HDC IM SUSR
1.0000 mL | Freq: Once | INTRAMUSCULAR | Status: AC
  Administered 2024-10-13: 1 mL via INTRAMUSCULAR
  Filled 2024-10-13: qty 1

## 2024-10-13 MED ORDER — RABIES IMMUNE GLOBULIN 300 UNIT/2ML IJ SOLN
20.0000 [IU]/kg | Freq: Once | INTRAMUSCULAR | Status: AC
  Administered 2024-10-13: 1200 [IU] via INTRAMUSCULAR
  Filled 2024-10-13: qty 8

## 2024-10-13 NOTE — ED Provider Notes (Signed)
 [place] [facility] Provider Note   CSN: [id number] Arrival date & time: 10/13/24  0845     Patient presents with: Rabies Exposure   Dawn Gilbert is a 68 y.o. female.   HPI     68 year old female presents with desire for rabies vaccination after a rabies exposure.  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  She had been drawing blood from the cat, handling the cat and his wounds with last close exposure 2 weeks ago, did see the cat from within his kennel this weekend.  No known bites or scratches--was handling the cat and performing wound care, blood draws without gloves.  The cat became sick over the weekend with fevers, lethargy and died and had testing that was positive for rabies.  She has not yet been vaccinated for rabies.   Past Medical History:  Diagnosis Date   Hoarseness    Palpitations    PONV (postoperative nausea and vomiting)      Prior to Admission medications  Medication Sig Start Date End Date Taking? Authorizing Provider  ibuprofen (ADVIL,MOTRIN) 200 MG tablet Take 600 mg by mouth every 6 (six) hours as needed for moderate pain.    [provider]  metoprolol  succinate (TOPROL -XL) 50 MG 24 hr tablet Take 1 tablet (50 mg total) by mouth daily. Take with or immediately following a meal. 12/11/23 03/10/24  Vicci Rollo JONELLE, PA-C  PREVIDENT 5000 BOOSTER PLUS 1.1 % PSTE Take 1 application  by mouth at bedtime. 05/09/15   [provider]    Allergies: Penicillins    Review of Systems  Updated Vital Signs BP 136/77 (BP Location: Right Arm)   Pulse 76   Temp (!) 97.5 F (36.4 C) (Oral)   Resp 16   SpO2 100%   Physical Exam Vitals and nursing note reviewed.  Constitutional:      General: She is not in acute distress.    Appearance: Normal appearance. She is not ill-appearing, toxic-appearing or diaphoretic.  HENT:      Head: Normocephalic.  Eyes:     Conjunctiva/sclera: Conjunctivae normal.  Cardiovascular:     Rate and Rhythm: Normal rate.  Pulmonary:     Effort: Pulmonary effort is normal. No respiratory distress.  Musculoskeletal:        General: No signs of injury.     Cervical back: No rigidity.  Skin:    General: Skin is warm and dry.     Coloration: Skin is not jaundiced or pale.  Neurological:     General: No focal deficit present.     Mental Status: She is alert and oriented to person, place, and time.     (all labs ordered are listed, but only abnormal results are displayed) Labs Reviewed - No data to display  EKG: None  Radiology: No results found.   Procedures   Medications Ordered in the ED  rabies immune globulin (HYPERRAB) injection 20 Units/kg (has no administration in time range)  rabies vaccine, human diploid (IMOVAX) injection 1 mL (1 mL Intramuscular Given 10/13/24 0910)                                    Medical Decision Making Risk Prescription drug management.   68 year old female presents with desire for rabies vaccination after a rabies  exposure.  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  No other concerns today.  Has not been vaccinated for rabies.   Given IG, first vaccine. TO return on day 3, 7 and 14.       Final diagnoses:  Rabies exposure    ED Discharge Orders     None          Dreama Longs, MD 10/13/24 [phone]

## 2024-10-13 NOTE — Discharge Instructions (Addendum)
 It was a pleasure caring for you ! Please return for rabies vaccine on day 3, 7, and 14. The early mornings are usually best.

## 2024-10-13 NOTE — ED Triage Notes (Signed)
 Pt works at [employer] where they were exposed to rabies from a cat. Denies cuts or bites.

## 2024-10-15 ENCOUNTER — Emergency Department (HOSPITAL_BASED_OUTPATIENT_CLINIC_OR_DEPARTMENT_OTHER)
Admission: EM | Admit: 2024-10-15 | Discharge: 2024-10-15 | Disposition: A | Discharge status: Home / Self Care | Attending: Emergency Medicine | Admitting: Emergency Medicine

## 2024-10-15 ENCOUNTER — Other Ambulatory Visit: Payer: Self-pay

## 2024-10-15 ENCOUNTER — Encounter (HOSPITAL_BASED_OUTPATIENT_CLINIC_OR_DEPARTMENT_OTHER): Payer: Self-pay

## 2024-10-15 DIAGNOSIS — Z23 Encounter for immunization: Secondary | ICD-10-CM | POA: Insufficient documentation

## 2024-10-15 DIAGNOSIS — Z203 Contact with and (suspected) exposure to rabies: Secondary | ICD-10-CM | POA: Insufficient documentation

## 2024-10-15 MED ORDER — RABIES VIRUS VACCINE, HDC IM SUSR
1.0000 mL | Freq: Once | INTRAMUSCULAR | Status: AC
  Administered 2024-10-15: 1 mL via INTRAMUSCULAR
  Filled 2024-10-15: qty 1

## 2024-10-15 NOTE — Discharge Instructions (Signed)
 Follow up per your previous discharge paperwork

## 2024-10-15 NOTE — ED Notes (Signed)
 ED Provider at bedside.

## 2024-10-15 NOTE — ED Provider Notes (Signed)
 [place] [facility] Provider Note   CSN: [id number] Arrival date & time: 10/15/24  9141     Patient presents with: Rabies Injection   Dawn Gilbert is a 68 y.o. female returns to the emergency department for second in her series of rabies postexposure prophylaxis.  She was bitten by a estate manager/land agent.  No complaints   HPI     Prior to Admission medications  Medication Sig Start Date End Date Taking? Authorizing Provider  ibuprofen (ADVIL,MOTRIN) 200 MG tablet Take 600 mg by mouth every 6 (six) hours as needed for moderate pain.    [provider]  metoprolol  succinate (TOPROL -XL) 50 MG 24 hr tablet Take 1 tablet (50 mg total) by mouth daily. Take with or immediately following a meal. 12/11/23 03/10/24  Vicci Rollo JONELLE, PA-C  PREVIDENT 5000 BOOSTER PLUS 1.1 % PSTE Take 1 application  by mouth at bedtime. 05/09/15   [provider]    Allergies: Penicillins    Review of Systems  Updated Vital Signs BP 123/77 (BP Location: Right Arm)   Pulse 67   Temp 98.4 F (36.9 C) (Oral)   Resp 17   Ht 5' 3 (1.6 m)   Wt 56.7 kg   SpO2 99%   BMI 22.14 kg/m   Physical Exam Vitals and nursing note reviewed.  Constitutional:      General: She is not in acute distress.    Appearance: She is well-developed. She is not diaphoretic.  HENT:     Head: Normocephalic and atraumatic.     Right Ear: External ear normal.     Left Ear: External ear normal.     Nose: Nose normal.     Mouth/Throat:     Mouth: Mucous membranes are moist.  Eyes:     General: No scleral icterus.    Conjunctiva/sclera: Conjunctivae normal.  Cardiovascular:     Rate and Rhythm: Normal rate and regular rhythm.     Heart sounds: Normal heart sounds. No murmur heard.    No friction rub. No gallop.  Pulmonary:     Effort: Pulmonary effort is normal. No respiratory distress.     Breath sounds: Normal breath sounds.  Abdominal:     General: Bowel sounds are normal.  There is no distension.     Palpations: Abdomen is soft. There is no mass.     Tenderness: There is no abdominal tenderness. There is no guarding.  Musculoskeletal:     Cervical back: Normal range of motion.  Skin:    General: Skin is warm and dry.  Neurological:     Mental Status: She is alert and oriented to person, place, and time.  Psychiatric:        Behavior: Behavior normal.     (all labs ordered are listed, but only abnormal results are displayed) Labs Reviewed - No data to display  EKG: None  Radiology: No results found.   Procedures   Medications Ordered in the ED - No data to display                                  Medical Decision Making  Patient here for second of her rabies series.  Appears appropriate for discharge.     Final diagnoses:  None    ED Discharge Orders     None          Arloa Chroman,  PA-C 10/15/24 1130    Towana Ozell BROCKS, MD 10/15/24 [phone]

## 2024-10-15 NOTE — ED Triage Notes (Signed)
 Patient arrives for repeat rabies vaccination.

## 2024-10-19 ENCOUNTER — Emergency Department (HOSPITAL_BASED_OUTPATIENT_CLINIC_OR_DEPARTMENT_OTHER): Admission: EM | Admit: 2024-10-19 | Discharge: 2024-10-19 | Disposition: A | Discharge status: Home / Self Care

## 2024-10-19 ENCOUNTER — Encounter (HOSPITAL_BASED_OUTPATIENT_CLINIC_OR_DEPARTMENT_OTHER): Payer: Self-pay | Admitting: Emergency Medicine

## 2024-10-19 ENCOUNTER — Other Ambulatory Visit: Payer: Self-pay

## 2024-10-19 DIAGNOSIS — Z203 Contact with and (suspected) exposure to rabies: Secondary | ICD-10-CM | POA: Diagnosis not present

## 2024-10-19 DIAGNOSIS — Z2914 Encounter for prophylactic rabies immune globin: Secondary | ICD-10-CM | POA: Diagnosis not present

## 2024-10-19 DIAGNOSIS — Z23 Encounter for immunization: Secondary | ICD-10-CM | POA: Diagnosis present

## 2024-10-19 MED ORDER — RABIES VIRUS VACCINE, HDC IM SUSR
1.0000 mL | Freq: Once | INTRAMUSCULAR | Status: AC
  Administered 2024-10-19: 10:00:00 1 mL via INTRAMUSCULAR
  Filled 2024-10-19: qty 1

## 2024-10-19 NOTE — ED Notes (Signed)
 Pt reports no issues with previous Injections. Pt aware to return one week for next rabies. Ambulatory at discharge

## 2024-10-19 NOTE — ED Provider Notes (Signed)
 [place] [facility] Provider Note   CSN: [id number] Arrival date & time: 10/19/24  0900     Patient presents with: Rabies Injection   Dawn Gilbert is a 68 y.o. female with no significant past medical history who presents to the ED for her third rabies shot.  Last injection on 12/13.  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  No direct bites or scratches.  No concerns.  History obtained from patient and past medical records. No interpreter used during encounter.       Prior to Admission medications  Medication Sig Start Date End Date Taking? Authorizing Provider  ibuprofen (ADVIL,MOTRIN) 200 MG tablet Take 600 mg by mouth every 6 (six) hours as needed for moderate pain.    [provider]  metoprolol  succinate (TOPROL -XL) 50 MG 24 hr tablet Take 1 tablet (50 mg total) by mouth daily. Take with or immediately following a meal. 12/11/23 03/10/24  Vicci Rollo JONELLE, PA-C  PREVIDENT 5000 BOOSTER PLUS 1.1 % PSTE Take 1 application  by mouth at bedtime. 05/09/15   [provider]    Allergies: Penicillins    Review of Systems  Skin:  Negative for wound.    Updated Vital Signs BP 130/71 (BP Location: Right Arm)   Pulse 66   Temp 98 F (36.7 C) (Oral)   Resp 16   Ht 5' 3 (1.6 m)   Wt 56.7 kg   SpO2 100%   BMI 22.14 kg/m   Physical Exam Constitutional:      General: She is not in acute distress. HENT:     Head: Normocephalic.  Cardiovascular:     Rate and Rhythm: Normal rate.  Pulmonary:     Effort: Pulmonary effort is normal.  Musculoskeletal:        General: Normal range of motion.     Cervical back: Normal range of motion and neck supple.  Skin:    Coloration: Skin is not jaundiced.     Comments: No scratches or bites  Neurological:     Mental Status: Mental status is at baseline.  Psychiatric:        Mood and Affect: Mood normal.        Behavior: Behavior normal.     (all labs ordered are listed,  but only abnormal results are displayed) Labs Reviewed - No data to display  EKG: None  Radiology: No results found.   Procedures   Medications Ordered in the ED  rabies vaccine , human diploid (IMOVAX) injection 1 mL (has no administration in time range)                                    Medical Decision Making Risk Prescription drug management.   68 year old female presents to the ED for her third rabies vaccine .  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  No direct bites or scratches.  No concerns.  Patient given injection here in the ED.  Patient will return for her fourth rabies vaccine .  Patient stable for discharge. Strict ED precautions discussed with patient. Patient states understanding and agrees to plan. Patient discharged home in no acute distress and stable vitals  Elderly >65    Final diagnoses:  Rabies vaccine  administered    ED Discharge Orders     None          Akita Maxim,  Aleck BROCKS, PA-C 10/19/24 9075    Neysa Caron PARAS, DO 10/19/24 [phone]

## 2024-10-19 NOTE — ED Triage Notes (Signed)
 Pt reports she needs her 3rd round of rabies shots. Last injection on Sat

## 2024-10-26 ENCOUNTER — Encounter (HOSPITAL_BASED_OUTPATIENT_CLINIC_OR_DEPARTMENT_OTHER): Payer: Self-pay | Admitting: Emergency Medicine

## 2024-10-26 ENCOUNTER — Emergency Department (HOSPITAL_BASED_OUTPATIENT_CLINIC_OR_DEPARTMENT_OTHER)
Admission: EM | Admit: 2024-10-26 | Discharge: 2024-10-26 | Disposition: A | Discharge status: Home / Self Care | Attending: Emergency Medicine | Admitting: Emergency Medicine

## 2024-10-26 ENCOUNTER — Other Ambulatory Visit: Payer: Self-pay

## 2024-10-26 DIAGNOSIS — Z23 Encounter for immunization: Secondary | ICD-10-CM | POA: Insufficient documentation

## 2024-10-26 DIAGNOSIS — Z2914 Encounter for prophylactic rabies immune globin: Secondary | ICD-10-CM | POA: Insufficient documentation

## 2024-10-26 DIAGNOSIS — Z203 Contact with and (suspected) exposure to rabies: Secondary | ICD-10-CM | POA: Diagnosis not present

## 2024-10-26 MED ORDER — RABIES VIRUS VACCINE, HDC IM SUSR
1.0000 mL | Freq: Once | INTRAMUSCULAR | Status: AC
  Administered 2024-10-26: 1 mL via INTRAMUSCULAR
  Filled 2024-10-26: qty 1

## 2024-10-26 NOTE — ED Provider Notes (Signed)
" °  Dawn Gilbert Provider Note   CSN: [id number] Arrival date & time: 10/26/24  9261     Patient presents with: No chief complaint on file.   Dawn Gilbert is a 68 y.o. female.   Patient here for her last rabies vaccination.  She is tolerated otherwise she is fine.  The history is provided by the patient.       Prior to Admission medications  Medication Sig Start Date End Date Taking? Authorizing Provider  ibuprofen (ADVIL,MOTRIN) 200 MG tablet Take 600 mg by mouth every 6 (six) hours as needed for moderate pain.    [provider]  metoprolol  succinate (TOPROL -XL) 50 MG 24 hr tablet Take 1 tablet (50 mg total) by mouth daily. Take with or immediately following a meal. 12/11/23 03/10/24  Vicci Rollo JONELLE, PA-C  PREVIDENT 5000 BOOSTER PLUS 1.1 % PSTE Take 1 application  by mouth at bedtime. 05/09/15   [provider]    Allergies: Penicillins    Review of Systems  Updated Vital Signs There were no vitals taken for this visit.  Physical Exam HENT:     Head: Normocephalic.  Pulmonary:     Effort: Pulmonary effort is normal.  Neurological:     Mental Status: She is alert.     (all labs ordered are listed, but only abnormal results are displayed) Labs Reviewed - No data to display  EKG: None  Radiology: No results found.   Procedures   Medications Ordered in the ED  rabies vaccine , human diploid (IMOVAX) injection 1 mL (has no administration in time range)                                    Medical Decision Making Risk Prescription drug management.   [name] is here for her last rabies vaccine .  Has tolerated others fine.  Given vaccine discharge.  This chart was dictated using voice recognition software.  Despite best efforts to proofread,  errors can occur which can change the documentation meaning.      Final diagnoses:  Need for prophylactic vaccination against rabies    ED  Discharge Orders     None          Dawn Cornet, DO 10/26/24 9256  "

## 2024-10-26 NOTE — ED Notes (Signed)

## 2024-10-26 NOTE — ED Triage Notes (Signed)
Pt requesting rabies vaccine.  

## 2024-11-29 ENCOUNTER — Other Ambulatory Visit: Payer: Self-pay | Admitting: Cardiology
